# Patient Record
Sex: Female | Born: 1935 | Race: White | Hispanic: No | Marital: Married | Smoking: Never smoker
Health system: Southern US, Community
[De-identification: ages and names within clinical notes are randomized; demographics above are authoritative.]

## PROBLEM LIST (undated history)

## (undated) DIAGNOSIS — R066 Hiccough: Secondary | ICD-10-CM

## (undated) DIAGNOSIS — F0390 Unspecified dementia without behavioral disturbance: Secondary | ICD-10-CM

## (undated) DIAGNOSIS — K219 Gastro-esophageal reflux disease without esophagitis: Secondary | ICD-10-CM

## (undated) DIAGNOSIS — E079 Disorder of thyroid, unspecified: Secondary | ICD-10-CM

## (undated) DIAGNOSIS — R410 Disorientation, unspecified: Secondary | ICD-10-CM

## (undated) DIAGNOSIS — M1990 Unspecified osteoarthritis, unspecified site: Secondary | ICD-10-CM

## (undated) DIAGNOSIS — I10 Essential (primary) hypertension: Secondary | ICD-10-CM

## (undated) DIAGNOSIS — H409 Unspecified glaucoma: Secondary | ICD-10-CM

## (undated) HISTORY — DX: Essential (primary) hypertension: I10

## (undated) HISTORY — DX: Disorder of thyroid, unspecified: E07.9

## (undated) HISTORY — DX: Unspecified glaucoma: H40.9

## (undated) HISTORY — DX: Disorientation, unspecified: R41.0

## (undated) HISTORY — DX: Hiccough: R06.6

## (undated) HISTORY — DX: Gastro-esophageal reflux disease without esophagitis: K21.9

---

## 1998-08-31 ENCOUNTER — Other Ambulatory Visit: Admission: RE | Admit: 1998-08-31 | Discharge: 1998-08-31 | Payer: Self-pay | Admitting: Family Medicine

## 1999-09-28 ENCOUNTER — Other Ambulatory Visit: Admission: RE | Admit: 1999-09-28 | Discharge: 1999-09-28 | Payer: Self-pay | Admitting: Family Medicine

## 2000-11-13 ENCOUNTER — Other Ambulatory Visit: Admission: RE | Admit: 2000-11-13 | Discharge: 2000-11-13 | Payer: Self-pay | Admitting: Family Medicine

## 2003-11-18 ENCOUNTER — Other Ambulatory Visit: Admission: RE | Admit: 2003-11-18 | Discharge: 2003-11-18 | Payer: Self-pay | Admitting: Family Medicine

## 2005-08-20 ENCOUNTER — Ambulatory Visit (HOSPITAL_COMMUNITY): Admission: RE | Admit: 2005-08-20 | Discharge: 2005-08-20 | Payer: Self-pay | Admitting: Ophthalmology

## 2005-11-19 ENCOUNTER — Ambulatory Visit (HOSPITAL_COMMUNITY): Admission: RE | Admit: 2005-11-19 | Discharge: 2005-11-19 | Payer: Self-pay | Admitting: Ophthalmology

## 2006-01-29 ENCOUNTER — Other Ambulatory Visit: Admission: RE | Admit: 2006-01-29 | Discharge: 2006-01-29 | Payer: Self-pay | Admitting: Family Medicine

## 2009-07-25 ENCOUNTER — Ambulatory Visit (HOSPITAL_COMMUNITY): Admission: RE | Admit: 2009-07-25 | Discharge: 2009-07-25 | Payer: Self-pay | Admitting: Ophthalmology

## 2009-11-07 ENCOUNTER — Ambulatory Visit (HOSPITAL_COMMUNITY): Admission: RE | Admit: 2009-11-07 | Discharge: 2009-11-07 | Payer: Self-pay | Admitting: Ophthalmology

## 2010-08-07 LAB — BASIC METABOLIC PANEL
BUN: 17 mg/dL (ref 6–23)
Chloride: 101 mEq/L (ref 96–112)
Glucose, Bld: 80 mg/dL (ref 70–99)
Potassium: 4.7 mEq/L (ref 3.5–5.1)
Sodium: 135 mEq/L (ref 135–145)

## 2010-08-14 LAB — BASIC METABOLIC PANEL
BUN: 15 mg/dL (ref 6–23)
CO2: 30 mEq/L (ref 19–32)
Chloride: 103 mEq/L (ref 96–112)
Creatinine, Ser: 1 mg/dL (ref 0.4–1.2)
GFR calc Af Amer: >60 mL/min (ref >60)
Potassium: 4.5 mEq/L (ref 3.5–5.1)

## 2010-08-14 LAB — HEMOGLOBIN AND HEMATOCRIT, BLOOD: HCT: 40.6 % (ref 36.0–46.0)

## 2012-09-11 ENCOUNTER — Ambulatory Visit (INDEPENDENT_AMBULATORY_CARE_PROVIDER_SITE_OTHER): Payer: Medicare Other | Admitting: Nurse Practitioner

## 2012-09-11 VITALS — BP 162/80 | HR 85 | Temp 98.2°F | Ht 60.0 in | Wt 134.0 lb

## 2012-09-11 DIAGNOSIS — N200 Calculus of kidney: Secondary | ICD-10-CM

## 2012-09-11 DIAGNOSIS — I10 Essential (primary) hypertension: Secondary | ICD-10-CM

## 2012-09-11 DIAGNOSIS — M899 Disorder of bone, unspecified: Secondary | ICD-10-CM

## 2012-09-11 DIAGNOSIS — M8580 Other specified disorders of bone density and structure, unspecified site: Secondary | ICD-10-CM | POA: Insufficient documentation

## 2012-09-11 DIAGNOSIS — E039 Hypothyroidism, unspecified: Secondary | ICD-10-CM | POA: Insufficient documentation

## 2012-09-11 DIAGNOSIS — L989 Disorder of the skin and subcutaneous tissue, unspecified: Secondary | ICD-10-CM

## 2012-09-11 MED ORDER — DOXYCYCLINE HYCLATE 100 MG PO TABS: 100.0000 mg | ORAL_TABLET | Freq: Two times a day (BID) | ORAL | Status: DC

## 2012-09-11 NOTE — Patient Instructions (Signed)
Wound Care  Wound care helps prevent pain and infection.    You may need a tetanus shot if:   You cannot remember when you had your last tetanus shot.   You have never had a tetanus shot.   The injury broke your skin.  If you need a tetanus shot and you choose not to have one, you may get tetanus. Sickness from tetanus can be serious.  HOME CARE     Only take medicine as told by your doctor.   Clean the wound daily with mild soap and water.   Change any bandages (dressings) as told by your doctor.   Put medicated cream and a bandage on the wound as told by your doctor.   Change the bandage if it gets wet, dirty, or starts to smell.   Take showers. Do not take baths, swim, or do anything that puts your wound under water.   Rest and raise (elevate) the wound until the pain and puffiness (swelling) are better.   Keep all doctor visits as told.  GET HELP RIGHT AWAY IF:     Yellowish-white fluid (pus) comes from the wound.   Medicine does not lessen your pain.   There is a red streak going away from the wound.   You have a fever.  MAKE SURE YOU:     Understand these instructions.   Will watch your condition.   Will get help right away if you are not doing well or get worse.  Document Released: 02/14/2008 Document Revised: 07/30/2011 Document Reviewed: 09/10/2010  [employer] Patient Information 2013 [employer].

## 2012-09-11 NOTE — Progress Notes (Signed)
  Subjective:    Patient ID: Ruth Case, female    DOB: [birth year], 79 y.o.   MRN: [id number]  HPI Patient has found some sore spots on the back of right knee. Patient has gotten ticks off of herself and wonders if that is what these areas are from.    Review of Systems  Constitutional: Negative for fever, chills and fatigue.  Eyes: Negative.   Respiratory: Negative.   Cardiovascular: Negative.   Neurological: Negative for dizziness and headaches.       Objective:   Physical Exam  Constitutional: She appears well-developed and well-nourished.  Cardiovascular: Normal rate, regular rhythm and normal heart sounds.   Pulmonary/Chest: Effort normal and breath sounds normal.  Skin:     three 1 cm erythematous lesion in shape of triangle right post thigh. Erythema extending from most medial lesion.   BP 162/80  Pulse 85  Temp(Src) 98.2 F (36.8 C) (Oral)  Ht 5' (1.524 m)  Wt 134 lb (60.782 kg)  BMI 26.17 kg/m2       Assessment & Plan:  3 skin lesions right post thigh secondary to bug bite  Doxycycline 100mg  1 PO BID X 10 days 0 refills  Keep area clean and dry  Follow-upif worsening  Mary-Margaret Daphine Deutscher, FNP

## 2012-10-16 ENCOUNTER — Encounter: Payer: Self-pay | Admitting: Nurse Practitioner

## 2012-10-16 ENCOUNTER — Ambulatory Visit (INDEPENDENT_AMBULATORY_CARE_PROVIDER_SITE_OTHER): Payer: Medicare Other | Admitting: Nurse Practitioner

## 2012-10-16 VITALS — BP 146/81 | HR 83 | Temp 98.1°F | Ht 60.5 in | Wt 129.0 lb

## 2012-10-16 DIAGNOSIS — R413 Other amnesia: Secondary | ICD-10-CM

## 2012-10-16 DIAGNOSIS — R51 Headache: Secondary | ICD-10-CM

## 2012-10-16 LAB — COMPLETE METABOLIC PANEL WITH GFR
ALT: 13 U/L (ref 0–35)
AST: 20 U/L (ref 0–37)
Alkaline Phosphatase: 99 U/L (ref 39–117)
CO2: 30 mEq/L (ref 19–32)
Creat: 0.84 mg/dL (ref 0.50–1.10)
Sodium: 139 mEq/L (ref 135–145)
Total Bilirubin: 0.9 mg/dL (ref 0.3–1.2)
Total Protein: 7.1 g/dL (ref 6.0–8.3)

## 2012-10-16 LAB — POCT UA - MICROSCOPIC ONLY: Crystals, Ur, HPF, POC: NEGATIVE

## 2012-10-16 LAB — POCT CBC
Hemoglobin: 15.7 g/dL (ref 12.2–16.2)
Lymph, poc: 1.5 (ref 0.6–3.4)
MCH, POC: 29.4 pg (ref 27–31.2)
MCHC: 33.3 g/dL (ref 31.8–35.4)
MPV: 8.6 fL (ref 0–99.8)
POC Granulocyte: 5.8 (ref 2–6.9)
POC LYMPH PERCENT: 19.4 %L (ref 10–50)
Platelet Count, POC: 238 10*3/uL (ref 142–424)
RDW, POC: 15.1 %

## 2012-10-16 LAB — POCT URINALYSIS DIPSTICK
Glucose, UA: NEGATIVE
Nitrite, UA: NEGATIVE
Spec Grav, UA: 1.02
Urobilinogen, UA: NEGATIVE
pH, UA: 6

## 2012-10-16 LAB — THYROID PANEL WITH TSH
Free Thyroxine Index: 3.1 (ref 1.0–3.9)
T4, Total: 9.4 ug/dL (ref 5.0–12.5)
TSH: 4.54 u[IU]/mL — ABNORMAL HIGH (ref 0.350–4.500)

## 2012-10-16 MED ORDER — DONEPEZIL HCL 10 MG PO TABS: 10.0000 mg | ORAL_TABLET | Freq: Every evening | ORAL | Status: DC | PRN

## 2012-10-16 MED ORDER — MEMANTINE HCL 5 MG PO TABS: 5.0000 mg | ORAL_TABLET | Freq: Two times a day (BID) | ORAL | Status: DC

## 2012-10-16 NOTE — Progress Notes (Signed)
  Subjective:    Patient ID: Ruth Case, female    DOB: 28-Apr-1928, 79 y.o.   MRN: [id number]  HPI Brought in by family for evaluation because famiily called DR. Christell Constant stating that patient had been having frequent headaches and has been acting strange. Talking out if her head at times. Has been saying that "Cat keeps locking her out of the HOuse". Loosing things, unsafe to drive. With headaches they occur daily and family says they were pretty severe. Confusion but no nausea or vomiting. Denies blurred vision. No c/o dizziness or unsteadiness. No recent weight loss or weight gain.    Review of Systems  All other systems reviewed and are negative.       Objective:   Physical Exam  Constitutional: She is oriented to person, place, and time. She appears well-developed and well-nourished.  Neck: Normal range of motion.  Cardiovascular: Normal rate.   Murmur (2/6 systolic) heard. Pulmonary/Chest: Effort normal and breath sounds normal.  Abdominal: Soft. Bowel sounds are normal.  Musculoskeletal: Normal range of motion.  Neurological: She is alert and oriented to person, place, and time. She has normal reflexes.  Skin: Skin is warm and dry.  Psychiatric: She has a normal mood and affect. Her behavior is normal. Judgment and thought content normal.  mini mental exam score of 19. BP 146/81  Pulse 83  Temp(Src) 98.1 F (36.7 C) (Oral)  Ht 5' 0.5" (1.537 m)  Wt 129 lb (58.514 kg)  BMI 24.77 kg/m2       Assessment & Plan:  1. Chronic headaches Keep diary of headaches Avoid caffeine - MR Brain W Wo Contrast; Future  2. Memory changes Family will watch her closely - donepezil (ARICEPT) 10 MG tablet; Take 1 tablet (10 mg total) by mouth at bedtime as needed.  Dispense: 30 tablet; Refill: 5 - memantine (NAMENDA) 5 MG tablet; Take 1 tablet (5 mg total) by mouth 2 (two) times daily.  Dispense: 30 tablet; Refill: 5 - POCT CBC - COMPLETE METABOLIC PANEL WITH GFR - Vitamin D 25  hydroxy - NMR Lipoprofile with Lipids - Thyroid Panel With TSH - POCT urinalysis dipstick - POCT UA - Microscopic Only - MR Brain W Wo Contrast; Future   Mary-Margaret Daphine Deutscher, FNP

## 2012-10-16 NOTE — Patient Instructions (Signed)
Dementia Dementia is a general term for problems with brain function. A person with dementia has memory loss and a hard time with at least one other brain function such as thinking, speaking, or problem solving. Dementia can affect social functioning, how you do your job, your mood, or your personality. The changes may be hidden for a long time. The earliest forms of this disease are usually not detected by family or friends. Dementia can be:  Irreversible.  Potentially reversible.  Partially reversible.  Progressive. This means it can get worse over time. CAUSES  Irreversible dementia causes may include:  Degeneration of brain cells (Alzheimer's disease or lewy body dementia).  Multiple small strokes (vascular dementia).  Infection (chronic meningitis or Creutzfelt-Jakob disease).  Frontotemporal dementia. This affects younger people, age 40 to 70, compared to those who have Alzheimer's disease.  Dementia associated with other disorders like Parkinson's disease, Huntington's disease, or HIV-associated dementia. Potentially or partially reversible dementia causes may include:  Medicines.  Metabolic causes such as excessive alcohol intake, vitamin B12 deficiency, or thyroid disease.  Masses or pressure in the brain such as a tumor, blood clot, or hydrocephalus. SYMPTOMS  Symptoms are often hard to detect. Family members or coworkers may not notice them early in the disease process. Different people with dementia may have different symptoms. Symptoms can include:  A hard time with memory, especially recent memory. Long-term memory may not be impaired.  Asking the same question multiple times or forgetting something someone just said.  A hard time speaking your thoughts or finding certain words.  A hard time solving problems or performing familiar tasks (such as how to use a telephone).  Sudden changes in mood.  Changes in personality, especially increasing moodiness or  mistrust.  Depression.  A hard time understanding complex ideas that were never a problem in the past. DIAGNOSIS  There are no specific tests for dementia.   Your caregiver may recommend a thorough evaluation. This is because some forms of dementia can be reversible. The evaluation will likely include a physical exam and getting a detailed history from you and a family member. The history often gives the best clues and suggestions for a diagnosis.  Memory testing may be done. A detailed brain function evaluation called neuropsychologic testing may be helpful.  Lab tests and brain imaging (such as a CT scan or MRI scan) are sometimes important.  Sometimes observation and re-evaluation over time is very helpful. TREATMENT  Treatment depends on the cause.   If the problem is a vitamin deficiency, it may be helped or cured with supplements.  For dementias such as Alzheimer's disease, medicines are available to stabilize or slow the course of the disease. There are no cures for this type of dementia.  Your caregiver can help direct you to groups, organizations, and other caregivers to help with decisions in the care of you or your loved one. HOME CARE INSTRUCTIONS The care of individuals with dementia is varied and dependent upon the progression of the dementia. The following suggestions are intended for the person living with, or caring for, the person with dementia.  Create a safe environment.  Remove the locks on bathroom doors to prevent the person from accidentally locking himself or herself in.  Use childproof latches on kitchen cabinets and any place where cleaning supplies, chemicals, or alcohol are kept.  Use childproof covers in unused electrical outlets.  Install childproof devices to keep doors and windows secured.  Remove stove knobs or install safety   knobs and an automatic shut-off on the stove.  Lower the temperature on water heaters.  Label medicines and keep them  locked up.  Secure knives, lighters, matches, power tools, and guns, and keep these items out of reach.  Keep the house free from clutter. Remove rugs or anything that might contribute to a fall.  Remove objects that might break and hurt the person.  Make sure lighting is good, both inside and outside.  Install grab rails as needed.  Use a monitoring device to alert you to falls or other needs for help.  Reduce confusion.  Keep familiar objects and people around.  Use night lights or dim lights at night.  Label items or areas.  Use reminders, notes, or directions for daily activities or tasks.  Keep a simple, consistent routine for waking, meals, bathing, dressing, and bedtime.  Create a calm, quiet environment.  Place large clocks and calendars prominently.  Display emergency numbers and home address near all telephones.  Use cues to establish different times of the day. An example is to open curtains to let the natural light in during the day.   Use effective communication.  Choose simple words and short sentences.  Use a gentle, calm tone of voice.  Be careful not to interrupt.  If the person is struggling to find a word or communicate a thought, try to provide the word or thought.  Ask one question at a time. Allow the person ample time to answer questions. Repeat the question again if the person does not respond.  Reduce nighttime restlessness.  Provide a comfortable bed.  Have a consistent nighttime routine.  Ensure a regular walking or physical activity schedule. Involve the person in daily activities as much as possible.  Limit napping during the day.  Limit caffeine.  Attend social events that stimulate rather than overwhelm the senses.  Encourage good nutrition and hydration.  Reduce distractions during meal times and snacks.  Avoid foods that are too hot or too cold.  Monitor chewing and swallowing ability.  Continue with routine vision,  hearing, dental, and medical screenings.  Only give over-the-counter or prescription medicines as directed by the caregiver.  Monitor driving abilities. Do not allow the person to drive when safe driving is no longer possible.  Register with an identification program which could provide location assistance in the event of a missing person situation. SEEK MEDICAL CARE IF:   New behavioral problems start such as moodiness, aggressiveness, or seeing things that are not there (hallucinations).  Any new problem with brain function happens. This includes problems with balance, speech, or falling a lot.  Problems with swallowing develop.  Any symptoms of other illness happen. Small changes or worsening in any aspect of brain function can be a sign that the illness is getting worse. It can also be a sign of another medical illness such as infection. Seeing a caregiver right away is important. SEEK IMMEDIATE MEDICAL CARE IF:   A fever develops.  New or worsened confusion develops.  New or worsened sleepiness develops.  Staying awake becomes hard to do. Document Released: 10/31/2000 Document Revised: 07/30/2011 Document Reviewed: 10/02/2010 Exit[employer] Patient Information 2014 [employer].  

## 2012-10-17 ENCOUNTER — Other Ambulatory Visit: Payer: Self-pay | Admitting: Nurse Practitioner

## 2012-10-17 LAB — NMR LIPOPROFILE WITH LIPIDS
Cholesterol, Total: 174 mg/dL (ref <200)
HDL Particle Number: 34.9 umol/L (ref >=30.5)
HDL-C: 67 mg/dL (ref >=40)
LDL (calc): 91 mg/dL (ref <100)
LDL Particle Number: 909 nmol/L (ref <1000)
LP-IR Score: <25 (ref <=45)
Small LDL Particle Number: <90 nmol/L (ref <=527)
Triglycerides: 81 mg/dL (ref <150)
VLDL Size: 45.7 nm (ref <=46.6)

## 2012-10-17 MED ORDER — LEVOTHYROXINE SODIUM 75 MCG PO TABS: 75.0000 ug | ORAL_TABLET | Freq: Every day | ORAL | Status: DC

## 2012-10-18 ENCOUNTER — Ambulatory Visit (INDEPENDENT_AMBULATORY_CARE_PROVIDER_SITE_OTHER): Payer: Medicare Other | Admitting: Nurse Practitioner

## 2012-10-18 VITALS — BP 146/80 | Temp 97.5°F | Ht 60.5 in | Wt 129.0 lb

## 2012-10-18 DIAGNOSIS — R112 Nausea with vomiting, unspecified: Secondary | ICD-10-CM

## 2012-10-18 DIAGNOSIS — R51 Headache: Secondary | ICD-10-CM

## 2012-10-18 MED ORDER — MEPERIDINE HCL 25 MG/ML IJ SOLN
50.0000 mg | INTRAMUSCULAR | Status: AC
  Administered 2012-10-18: 50 mg via INTRAMUSCULAR

## 2012-10-18 MED ORDER — ONDANSETRON HCL 4 MG PO TABS: 4.0000 mg | ORAL_TABLET | Freq: Three times a day (TID) | ORAL | Status: DC | PRN

## 2012-10-18 MED ORDER — PROMETHAZINE HCL 25 MG/ML IJ SOLN
25.0000 mg | Freq: Once | INTRAMUSCULAR | Status: AC
  Administered 2012-10-18: 25 mg via INTRAMUSCULAR

## 2012-10-18 NOTE — Patient Instructions (Signed)

## 2012-10-18 NOTE — Progress Notes (Signed)
Subjective:     Patient ID: Doralee Albino, female   DOB: 24-Apr-1928, 79 y.o.   MRN: [id number]  HPI Patient brought in oday with nausea and vomiting that started this morning- She had a slight headache last night that has lasted through the night- She was just seen in the office last week and MRI of brain has already been scheduled. Patient unable to say anything about severity of headache due to dementia. She does say that she does not have any blurred vision.   Review of Systems  Constitutional: Positive for appetite change (decreased).  Eyes: Negative.   Respiratory: Negative.   Cardiovascular: Negative.   Gastrointestinal: Negative.   Endocrine: Negative.   Genitourinary: Negative.   Neurological: Positive for headaches. Negative for dizziness, tremors, facial asymmetry, speech difficulty, weakness and light-headedness.  Hematological: Negative.   Psychiatric/Behavioral: Negative.        Objective:   Physical Exam  Constitutional: She is oriented to person, place, and time. She appears well-developed and well-nourished.  Cardiovascular: Normal rate, normal heart sounds and intact distal pulses.   Pulmonary/Chest: Effort normal and breath sounds normal.  Abdominal: Soft. Bowel sounds are normal.  Dry heaves and vomiting during exam  Musculoskeletal: Normal range of motion.  Neurological: She is alert and oriented to person, place, and time. She has normal reflexes. No cranial nerve deficit.  Skin: Skin is warm.  Psychiatric: She has a normal mood and affect. Her behavior is normal. Judgment and thought content normal.  BP 146/80  Temp(Src) 97.5 F (36.4 C) (Oral)  Ht 5' 0.5" (1.537 m)  Wt 129 lb (58.514 kg)  BMI 24.77 kg/m2       Assessment:     1. Nausea with vomiting   2. Headache(784.0)         Plan:    Meds ordered this encounter  Medications  . meperidine (DEMEROL) injection 50 mg    Sig:   . promethazine (PHENERGAN) injection 25 mg    Sig:    First  24 Hours-Clear liquids  popsicles  Jello  gatorade  Sprite Second 24 hours-Add Full liquids ( Liquids you cant see through) Third 24 hours- Bland diet ( foods that are baked or broiled)  *avoiding fried foods and highly spiced foods* During these 3 days  Avoid milk, cheese, ice cream or any other dairy products  Avoid caffeine- REMEMBER Mt. Dew and Mello Yellow contain lots of caffeine You should eat and drink in  Frequent small volumes If no improvement in symptoms or worsen in 2-3 days should RETRUN TO OFFICE or go to ER!    Mary-Margaret Daphine Deutscher, FNP

## 2012-10-20 ENCOUNTER — Other Ambulatory Visit: Payer: Self-pay

## 2012-10-20 DIAGNOSIS — R413 Other amnesia: Secondary | ICD-10-CM

## 2012-10-22 ENCOUNTER — Ambulatory Visit
Admission: RE | Admit: 2012-10-22 | Discharge: 2012-10-22 | Disposition: A | Discharge status: Home / Self Care | Payer: 59 | Source: Ambulatory Visit | Attending: Nurse Practitioner | Admitting: Nurse Practitioner

## 2012-10-22 ENCOUNTER — Inpatient Hospital Stay
Admission: RE | Admit: 2012-10-22 | Discharge: 2012-10-22 | Disposition: A | Discharge status: Home / Self Care | Payer: Self-pay | Source: Ambulatory Visit | Attending: Nurse Practitioner | Admitting: Nurse Practitioner

## 2012-10-22 DIAGNOSIS — R413 Other amnesia: Secondary | ICD-10-CM

## 2012-10-24 ENCOUNTER — Telehealth: Payer: Self-pay | Admitting: Nurse Practitioner

## 2012-10-24 NOTE — Telephone Encounter (Signed)
Aware of results. Daughter will call us again if no improvement.

## 2012-11-03 ENCOUNTER — Telehealth: Payer: Self-pay | Admitting: Nurse Practitioner

## 2012-11-03 NOTE — Telephone Encounter (Signed)
NTBS.

## 2012-11-03 NOTE — Telephone Encounter (Signed)
Called and spoke with daughter. Has several different things going on. Has refused to come to office. Family has been trying to get her to come in for visit. Daughter will tell her she is to have thyroid rechecked to get her to come. If patient refuses will call and let us know.

## 2012-11-03 NOTE — Telephone Encounter (Signed)
Or have family call to discuss

## 2012-11-03 NOTE — Telephone Encounter (Signed)
Please advise 

## 2012-11-06 ENCOUNTER — Ambulatory Visit: Payer: Medicare Other | Admitting: Nurse Practitioner

## 2012-11-20 ENCOUNTER — Other Ambulatory Visit (INDEPENDENT_AMBULATORY_CARE_PROVIDER_SITE_OTHER): Payer: Medicare Other

## 2012-11-20 ENCOUNTER — Telehealth: Payer: Self-pay | Admitting: Nurse Practitioner

## 2012-11-20 DIAGNOSIS — E039 Hypothyroidism, unspecified: Secondary | ICD-10-CM

## 2012-11-20 LAB — THYROID PANEL WITH TSH: T4, Total: 10.7 ug/dL (ref 5.0–12.5)

## 2012-11-20 NOTE — Telephone Encounter (Signed)
Please advise 

## 2012-11-20 NOTE — Telephone Encounter (Signed)
I attempted to contact patient, without success. Please try to call her and inform if she continues to have diarrhea she may try OTC imodium as directed. She may need a med for acid reflux, but would need to be seen. If symptoms persist or worsen, seek emergency medical treatment. thx

## 2012-11-20 NOTE — Telephone Encounter (Signed)
Aware. 

## 2012-11-24 NOTE — Telephone Encounter (Signed)
Beano OTC and imodium for loose stools if doesn't improve will need to send to GI

## 2012-11-24 NOTE — Telephone Encounter (Signed)
Pt doesn't have diarrhea now but has not had a BM in four days. Still having the belching. They will call back if she doesn't improve.

## 2012-11-26 ENCOUNTER — Telehealth: Payer: Self-pay | Admitting: Nurse Practitioner

## 2012-11-26 NOTE — Telephone Encounter (Signed)
Patient is taking 39 and is aware of results

## 2012-12-09 ENCOUNTER — Telehealth: Payer: Self-pay | Admitting: Nurse Practitioner

## 2012-12-09 NOTE — Telephone Encounter (Signed)
Please advise 

## 2012-12-09 NOTE — Telephone Encounter (Signed)
Beano and gas x are all i know that work- may try [employer] gtts that kids use

## 2012-12-10 NOTE — Telephone Encounter (Signed)
Daughter aware.

## 2012-12-16 ENCOUNTER — Other Ambulatory Visit: Payer: Self-pay | Admitting: *Deleted

## 2012-12-16 MED ORDER — VALSARTAN 160 MG PO TABS: 160.0000 mg | ORAL_TABLET | Freq: Every day | ORAL | Status: DC

## 2013-03-05 ENCOUNTER — Ambulatory Visit: Payer: Medicare Other | Admitting: Nurse Practitioner

## 2013-03-27 ENCOUNTER — Ambulatory Visit: Payer: Medicare Other | Admitting: Nurse Practitioner

## 2013-04-10 ENCOUNTER — Encounter: Payer: Self-pay | Admitting: Nurse Practitioner

## 2013-04-10 ENCOUNTER — Ambulatory Visit (INDEPENDENT_AMBULATORY_CARE_PROVIDER_SITE_OTHER): Payer: Medicare Other | Admitting: Nurse Practitioner

## 2013-04-10 VITALS — BP 168/94 | HR 77 | Temp 99.1°F | Ht 60.0 in | Wt 121.0 lb

## 2013-04-10 DIAGNOSIS — R141 Gas pain: Secondary | ICD-10-CM

## 2013-04-10 DIAGNOSIS — R142 Eructation: Secondary | ICD-10-CM

## 2013-04-10 DIAGNOSIS — N3281 Overactive bladder: Secondary | ICD-10-CM

## 2013-04-10 DIAGNOSIS — M899 Disorder of bone, unspecified: Secondary | ICD-10-CM

## 2013-04-10 DIAGNOSIS — R413 Other amnesia: Secondary | ICD-10-CM

## 2013-04-10 DIAGNOSIS — E039 Hypothyroidism, unspecified: Secondary | ICD-10-CM

## 2013-04-10 DIAGNOSIS — M8580 Other specified disorders of bone density and structure, unspecified site: Secondary | ICD-10-CM

## 2013-04-10 DIAGNOSIS — Z23 Encounter for immunization: Secondary | ICD-10-CM

## 2013-04-10 DIAGNOSIS — I10 Essential (primary) hypertension: Secondary | ICD-10-CM

## 2013-04-10 DIAGNOSIS — R066 Hiccough: Secondary | ICD-10-CM

## 2013-04-10 DIAGNOSIS — N318 Other neuromuscular dysfunction of bladder: Secondary | ICD-10-CM

## 2013-04-10 MED ORDER — SOLIFENACIN SUCCINATE 10 MG PO TABS: 10.0000 mg | ORAL_TABLET | Freq: Every day | ORAL | Status: DC

## 2013-04-10 MED ORDER — MEMANTINE HCL 5 MG PO TABS: 5.0000 mg | ORAL_TABLET | Freq: Two times a day (BID) | ORAL | Status: DC

## 2013-04-10 MED ORDER — DONEPEZIL HCL 10 MG PO TABS: 10.0000 mg | ORAL_TABLET | Freq: Every evening | ORAL | Status: DC | PRN

## 2013-04-10 MED ORDER — BACLOFEN 10 MG PO TABS: 5.0000 mg | ORAL_TABLET | Freq: Three times a day (TID) | ORAL | Status: DC

## 2013-04-10 MED ORDER — LEVOTHYROXINE SODIUM 75 MCG PO TABS: 75.0000 ug | ORAL_TABLET | Freq: Every day | ORAL | Status: DC

## 2013-04-10 MED ORDER — ESOMEPRAZOLE MAGNESIUM 20 MG PO CPDR: 20.0000 mg | DELAYED_RELEASE_CAPSULE | Freq: Every day | ORAL | Status: DC

## 2013-04-10 MED ORDER — VALSARTAN 160 MG PO TABS: 160.0000 mg | ORAL_TABLET | Freq: Every day | ORAL | Status: DC

## 2013-04-10 NOTE — Progress Notes (Signed)
Subjective:    Patient ID: Ruth Case, female    DOB: 24-Aug-1927, 79 y.o.   MRN: [id number]  HPI patient in today for chronic follow up- she is doing ok - the only complaints is that she has spells of belchng that can last several hours- they have tried gasx and beano and otc prilosec with no relief. Happens several times a day. Patient Active Problem List   Diagnosis Date Noted  . Unspecified hypothyroidism 09/11/2012  . Essential hypertension, benign 09/11/2012  . Osteopenia 09/11/2012  . Nephrolithiasis 09/11/2012   Outpatient Encounter Prescriptions as of 04/10/2013  Medication Sig  . donepezil (ARICEPT) 10 MG tablet Take 1 tablet (10 mg total) by mouth at bedtime as needed.  Marland Kitchen esomeprazole (NEXIUM) 20 MG capsule Take 20 mg by mouth daily at 12 noon.  Marland Kitchen levothyroxine (SYNTHROID, LEVOTHROID) 75 MCG tablet Take 1 tablet (75 mcg total) by mouth daily.  . memantine (NAMENDA) 5 MG tablet Take 1 tablet (5 mg total) by mouth 2 (two) times daily.  . ondansetron (ZOFRAN) 4 MG tablet Take 1 tablet (4 mg total) by mouth every 8 (eight) hours as needed for nausea.  . valsartan (DIOVAN) 160 MG tablet Take 1 tablet (160 mg total) by mouth daily.  . [DISCONTINUED] levothyroxine (SYNTHROID, LEVOTHROID) 50 MCG tablet Take 50 mcg by mouth daily before breakfast.       Review of Systems  Constitutional: Negative.   HENT: Negative.   Respiratory: Negative.   Cardiovascular: Negative.   Gastrointestinal: Negative for nausea, abdominal pain, constipation and blood in stool.  Genitourinary: Negative.   Musculoskeletal: Negative.   Skin: Negative.   Hematological: Negative.   Psychiatric/Behavioral: Negative.        Objective:   Physical Exam  Constitutional: She is oriented to person, place, and time. She appears well-developed and well-nourished.  HENT:  Nose: Nose normal.  Mouth/Throat: Oropharynx is clear and moist.  Eyes: EOM are normal.  Neck: Trachea normal, normal range of  motion and full passive range of motion without pain. Neck supple. No JVD present. Carotid bruit is not present. No thyromegaly present.  Cardiovascular: Normal rate, regular rhythm, normal heart sounds and intact distal pulses.  Exam reveals no gallop and no friction rub.   No murmur heard. Pulmonary/Chest: Effort normal and breath sounds normal.  Abdominal: Soft. Bowel sounds are normal. She exhibits no distension and no mass. There is no tenderness.  Musculoskeletal: Normal range of motion.  Lymphadenopathy:    She has no cervical adenopathy.  Neurological: She is alert and oriented to person, place, and time. She has normal reflexes.  Skin: Skin is warm and dry.  Psychiatric: She has a normal mood and affect. Her behavior is normal. Judgment and thought content normal.   BP 168/94  Pulse 77  Temp(Src) 99.1 F (37.3 C) (Oral)  Ht 5' (1.524 m)  Wt 121 lb (54.885 kg)  BMI 23.63 kg/m2        Assessment & Plan:   1. Unspecified hypothyroidism   2. Essential hypertension, benign   3. Osteopenia   4. Overactive bladder   5. Belching   6. Hiccups   7. Memory changes    Orders Placed This Encounter  Procedures  . CMP14+EGFR  . NMR, lipoprofile  . H Pylori, IGM, IGG, IGA AB  . Thyroid Panel With TSH   Meds ordered this encounter  Medications  . DISCONTD: esomeprazole (NEXIUM) 20 MG capsule    Sig: Take 20 mg by mouth  daily at 12 noon.  Marland Kitchen DISCONTD: solifenacin (VESICARE) 10 MG tablet    Sig: Take 1 tablet (10 mg total) by mouth daily.    Dispense:  30 tablet    Refill:  5    Order Specific Question:  Supervising Provider    Answer:  Ernestina Penna [[id number]]  . donepezil (ARICEPT) 10 MG tablet    Sig: Take 1 tablet (10 mg total) by mouth at bedtime as needed.    Dispense:  30 tablet    Refill:  5    Order Specific Question:  Supervising Provider    Answer:  Ernestina Penna [[id number]]  . esomeprazole (NEXIUM) 20 MG capsule    Sig: Take 1 capsule (20 mg total) by mouth daily  at 12 noon.    Dispense:  30 capsule    Refill:  6    Order Specific Question:  Supervising Provider    Answer:  Ernestina Penna [[id number]]  . levothyroxine (SYNTHROID, LEVOTHROID) 75 MCG tablet    Sig: Take 1 tablet (75 mcg total) by mouth daily.    Dispense:  90 tablet    Refill:  3    Order Specific Question:  Supervising Provider    Answer:  Ernestina Penna [[id number]]  . memantine (NAMENDA) 5 MG tablet    Sig: Take 1 tablet (5 mg total) by mouth 2 (two) times daily.    Dispense:  30 tablet    Refill:  5    Order Specific Question:  Supervising Provider    Answer:  Ernestina Penna [[id number]]  . solifenacin (VESICARE) 10 MG tablet    Sig: Take 1 tablet (10 mg total) by mouth daily.    Dispense:  30 tablet    Refill:  5    Order Specific Question:  Supervising Provider    Answer:  Ernestina Penna [[id number]]  . valsartan (DIOVAN) 160 MG tablet    Sig: Take 1 tablet (160 mg total) by mouth daily.    Dispense:  30 tablet    Refill:  6    Order Specific Question:  Supervising Provider    Answer:  Ernestina Penna [[id number]]  . baclofen (LIORESAL) 10 MG tablet    Sig: Take 0.5 tablets (5 mg total) by mouth 3 (three) times daily.    Dispense:  90 each    Refill:  1    Order Specific Question:  Supervising Provider    Answer:  Ernestina Penna [[id number]]   Baclofen for hiccups- let me know if helps vesicare started for bladder Continue all meds Labs pending Diet and exercise encouraged Health maintenance reviewed Follow up in 3 months  Mary-Margaret Daphine Deutscher, FNP

## 2013-04-10 NOTE — Patient Instructions (Signed)
Hiccups °Hiccups are caused by a sudden contraction of the muscles between the ribs and the muscle under your lungs (diaphragm). When you hiccup, the top of your windpipe (glottis) closes immediately after your diaphragm contracts. This makes the typical 'hic' sound. A hiccup is a reflex that you cannot stop. Unlike other reflexes such as coughing and sneezing, hiccups do not seem to have any useful purpose. There are 3 types of hiccups:  °· Benign bouts: last less than 48 hours. °· Persistent: last more than 48 hours but less than 1 month. °· Intractable: last more than 1 month. °CAUSES  °Most people have bouts of hiccups from time to time. They start for no apparent reason, last a short while, and then stop. Sometimes they are due to: °· A temporary swollen stomach caused by overeating or eating too fast, eating spicy foods, drinking fizzy drinks, or swallowing air. °· A sudden change in temperature (very hot or cold foods or drinks, a cold shower). °· Drinking alcohol or using tobacco. °There are no particular tests used to diagnose hiccups. Hiccups are usually considered harmless and do not point to a serious medical condition. However, there can be underlying medical problems that may cause hiccups, such as pneumonia, diabetes, metabolic problems, tumors, abdominal infections or injuries, and neurologic problems. You must follow up with your caregiver if your symptoms persist or become a frequent problem. °TREATMENT  °· Most cases need no treatment. A bout of benign hiccups usually does not last long. °· Medicine is sometimes needed to stop persistent hiccups. Medicine may be given intravenously (IV) or by mouth. °· Hypnosis or acupuncture may be suggested. °· Surgery to affect the nerve that supplies the diaphragm may be tried in severe cases. °· Treatment of an underlying cause is needed in some cases. °HOME CARE INSTRUCTIONS  °Popular remedies that may stop a bout of hiccups include: °· Gargling ice  water. °· Swallowing granulated sugar. °· Biting on a lemon. °· Holding your breath, breathing fast, or breathing into a paper bag. °· Bearing down. °· Gasping after a sudden fright. °· Pulling your tongue gently. °· Distraction. °SEEK MEDICAL CARE IF:  °· Hiccups last for more than 48 hours. °· You are given medicine but your hiccups do not get better. °· New symptoms show up. °· You cannot sleep or eat due to the hiccups. °· You have unexpected weight loss. °· You have trouble breathing or swallowing. °· You develop severe pain in your abdomen or other areas. °· You develop numbness, tingling, or weakness. °Document Released: 07/16/2001 Document Revised: 07/30/2011 Document Reviewed: 06/28/2010 °[employer]® Patient Information ©2014 [employer]. ° °

## 2013-04-13 ENCOUNTER — Telehealth: Payer: Self-pay | Admitting: Nurse Practitioner

## 2013-04-13 ENCOUNTER — Other Ambulatory Visit: Payer: Self-pay | Admitting: *Deleted

## 2013-04-13 ENCOUNTER — Other Ambulatory Visit (INDEPENDENT_AMBULATORY_CARE_PROVIDER_SITE_OTHER): Payer: Medicare Other

## 2013-04-13 DIAGNOSIS — E875 Hyperkalemia: Secondary | ICD-10-CM

## 2013-04-13 NOTE — Telephone Encounter (Signed)
Spoke with daughter and Ms. Socarras came into today and had labs repeated.

## 2013-04-13 NOTE — Progress Notes (Signed)
Pt here for labs only. 

## 2013-04-14 LAB — CMP14+EGFR
Albumin/Globulin Ratio: 1.2 (ref 1.1–2.5)
Albumin: 3.8 g/dL (ref 3.5–4.7)
Alkaline Phosphatase: 119 IU/L — ABNORMAL HIGH (ref 39–117)
BUN/Creatinine Ratio: 18 (ref 11–26)
BUN: 21 mg/dL (ref 8–27)
GFR calc Af Amer: 49 mL/min/{1.73_m2} — ABNORMAL LOW (ref >59)
GFR calc non Af Amer: 42 mL/min/{1.73_m2} — ABNORMAL LOW (ref >59)
Glucose: 77 mg/dL (ref 65–99)
Total Bilirubin: 0.2 mg/dL (ref 0.0–1.2)
Total Protein: 7 g/dL (ref 6.0–8.5)

## 2013-04-14 LAB — NMR, LIPOPROFILE
Cholesterol: 165 mg/dL (ref <200)
HDL Particle Number: 33.4 umol/L (ref >=30.5)
LDL Size: 21.1 nm (ref >20.5)
LP-IR Score: <25 (ref <=45)
Small LDL Particle Number: 96 nmol/L (ref <=527)
Triglycerides by NMR: 63 mg/dL (ref <150)

## 2013-04-14 LAB — BMP8+EGFR
BUN: 20 mg/dL (ref 8–27)
CO2: 26 mmol/L (ref 18–29)
Calcium: 9.3 mg/dL (ref 8.6–10.2)
Creatinine, Ser: 0.98 mg/dL (ref 0.57–1.00)
Glucose: 90 mg/dL (ref 65–99)
Sodium: 141 mmol/L (ref 134–144)

## 2013-04-14 LAB — H PYLORI, IGM, IGG, IGA AB: H. pylori, IgM Abs: <9 units (ref 0.0–8.9)

## 2013-04-14 LAB — THYROID PANEL WITH TSH
Free Thyroxine Index: 2.6 (ref 1.2–4.9)
T3 Uptake Ratio: 29 % (ref 24–39)

## 2013-07-13 ENCOUNTER — Ambulatory Visit: Payer: Medicare Other | Admitting: Nurse Practitioner

## 2013-07-29 ENCOUNTER — Ambulatory Visit (INDEPENDENT_AMBULATORY_CARE_PROVIDER_SITE_OTHER): Payer: Medicare Other | Admitting: Nurse Practitioner

## 2013-07-29 ENCOUNTER — Telehealth: Payer: Self-pay | Admitting: Nurse Practitioner

## 2013-07-29 ENCOUNTER — Encounter: Payer: Self-pay | Admitting: Nurse Practitioner

## 2013-07-29 VITALS — BP 122/73 | HR 63 | Temp 98.0°F | Ht 60.0 in | Wt 121.0 lb

## 2013-07-29 DIAGNOSIS — E039 Hypothyroidism, unspecified: Secondary | ICD-10-CM

## 2013-07-29 DIAGNOSIS — I10 Essential (primary) hypertension: Secondary | ICD-10-CM

## 2013-07-29 DIAGNOSIS — M8580 Other specified disorders of bone density and structure, unspecified site: Secondary | ICD-10-CM

## 2013-07-29 DIAGNOSIS — M949 Disorder of cartilage, unspecified: Secondary | ICD-10-CM

## 2013-07-29 DIAGNOSIS — M899 Disorder of bone, unspecified: Secondary | ICD-10-CM

## 2013-07-29 MED ORDER — DONEPEZIL HCL 23 MG PO TABS: 23.0000 mg | ORAL_TABLET | Freq: Every day | ORAL | Status: DC

## 2013-07-29 NOTE — Progress Notes (Signed)
Subjective:    Patient ID: Ruth Case, female    DOB: December 10, 1927, 80 y.o.   MRN: [id number]  HPI Patient here today for follow up- Her daughter in law is with her today. Patient has history of dementia- was started on namenda and aricept- About 1 month ago she starting sitting around a lot doing nothing so her son stopped NAmneda and that seemed to perk her up. But she started not being able to finish her sentnces so he put her back on it and she started zoning out again. * patient is starting to experience incontinence about 2x a week Patient Active Problem List   Diagnosis Date Noted  . Unspecified hypothyroidism 09/11/2012  . Essential hypertension, benign 09/11/2012  . Osteopenia 09/11/2012  . Nephrolithiasis 09/11/2012   Outpatient Encounter Prescriptions as of 07/29/2013  Medication Sig  . baclofen (LIORESAL) 10 MG tablet Take 0.5 tablets (5 mg total) by mouth 3 (three) times daily.  Marland Kitchen esomeprazole (NEXIUM) 20 MG capsule Take 1 capsule (20 mg total) by mouth daily at 12 noon.  Marland Kitchen levothyroxine (SYNTHROID, LEVOTHROID) 75 MCG tablet Take 1 tablet (75 mcg total) by mouth daily.  . ondansetron (ZOFRAN) 4 MG tablet Take 1 tablet (4 mg total) by mouth every 8 (eight) hours as needed for nausea.  . solifenacin (VESICARE) 10 MG tablet Take 1 tablet (10 mg total) by mouth daily.  . valsartan (DIOVAN) 160 MG tablet Take 1 tablet (160 mg total) by mouth daily.  . [DISCONTINUED] donepezil (ARICEPT) 10 MG tablet Take 1 tablet (10 mg total) by mouth at bedtime as needed.  . [DISCONTINUED] memantine (NAMENDA) 5 MG tablet Take 1 tablet (5 mg total) by mouth 2 (two) times daily.  Marland Kitchen donepezil (ARICEPT) 23 MG TABS tablet Take 1 tablet (23 mg total) by mouth at bedtime.      Review of Systems  Constitutional: Negative for appetite change and fatigue.  HENT: Negative.   Respiratory: Negative.   Cardiovascular: Negative.   Gastrointestinal: Negative.   Genitourinary: Negative.     Neurological: Negative.   Psychiatric/Behavioral: Positive for dysphoric mood and agitation.  All other systems reviewed and are negative.       Objective:   Physical Exam  Constitutional: She appears well-developed and well-nourished.  HENT:  Nose: Nose normal.  Mouth/Throat: Oropharynx is clear and moist.  Eyes: EOM are normal.  Neck: Trachea normal, normal range of motion and full passive range of motion without pain. Neck supple. No JVD present. Carotid bruit is not present. No thyromegaly present.  Cardiovascular: Normal rate, regular rhythm, normal heart sounds and intact distal pulses.  Exam reveals no gallop and no friction rub.   No murmur heard. Pulmonary/Chest: Effort normal and breath sounds normal.  Abdominal: Soft. Bowel sounds are normal. She exhibits no distension and no mass. There is no tenderness.  Musculoskeletal: Normal range of motion.  Lymphadenopathy:    She has no cervical adenopathy.  Neurological: She is alert. She has normal reflexes.  Skin: Skin is warm and dry.  Psychiatric: She has a normal mood and affect. Her behavior is normal. Judgment and thought content normal.  Answers all questions appropriately    BP 122/73  Pulse 63  Temp(Src) 98 F (36.7 C) (Oral)  Ht 5' (1.524 m)  Wt 121 lb (54.885 kg)  BMI 23.63 kg/m2       Assessment & Plan:   1. Unspecified hypothyroidism   2. Osteopenia   3. Essential hypertension, benign  Orders Placed This Encounter  Procedures  . CMP14+EGFR  . NMR, lipoprofile      Meds ordered this encounter  Medications  . donepezil (ARICEPT) 23 MG TABS tablet    Sig: Take 1 tablet (23 mg total) by mouth at bedtime.    Dispense:  30 tablet    Refill:  5    Order Specific Question:  Supervising Provider    Answer:  Chipper Herb [[id number]]    Labs pending Health maintenance reviewed Diet and exercise encouraged Continue all meds Follow up  In 3 months   [place], FNP

## 2013-07-29 NOTE — Telephone Encounter (Signed)
Patient is taking namenda bid and the aricept she takes at bedtime and her symptoms have come back please advise patients daughter stated that she spoke with you about this at her appt today.

## 2013-07-29 NOTE — Patient Instructions (Signed)

## 2013-07-30 NOTE — Telephone Encounter (Signed)
Hold the namenda

## 2013-07-30 NOTE — Telephone Encounter (Signed)
Patient daughter aware and will let us know how she is doing

## 2013-07-31 LAB — CMP14+EGFR
A/G RATIO: 1.5 (ref 1.1–2.5)
ALBUMIN: 3.7 g/dL (ref 3.5–4.7)
ALK PHOS: 102 IU/L (ref 39–117)
ALT: 15 IU/L (ref 0–32)
AST: 17 IU/L (ref 0–40)
BILIRUBIN TOTAL: 0.4 mg/dL (ref 0.0–1.2)
BUN / CREAT RATIO: 22 (ref 11–26)
BUN: 22 mg/dL (ref 8–27)
CHLORIDE: 102 mmol/L (ref 97–108)
CO2: 22 mmol/L (ref 18–29)
Calcium: 8.7 mg/dL (ref 8.7–10.3)
Creatinine, Ser: 1 mg/dL (ref 0.57–1.00)
GFR, EST AFRICAN AMERICAN: 60 mL/min/{1.73_m2} (ref >59)
GFR, EST NON AFRICAN AMERICAN: 52 mL/min/{1.73_m2} — AB (ref >59)
Globulin, Total: 2.5 g/dL (ref 1.5–4.5)
Glucose: 81 mg/dL (ref 65–99)
POTASSIUM: 4.8 mmol/L (ref 3.5–5.2)
Sodium: 141 mmol/L (ref 134–144)
Total Protein: 6.2 g/dL (ref 6.0–8.5)

## 2013-07-31 LAB — NMR, LIPOPROFILE
CHOLESTEROL: 151 mg/dL (ref <200)
HDL Cholesterol by NMR: 66 mg/dL (ref >=40)
HDL Particle Number: 28.5 umol/L — ABNORMAL LOW (ref >=30.5)
LDL PARTICLE NUMBER: 633 nmol/L (ref <1000)
LDL SIZE: 21.4 nm (ref >20.5)
LDLC SERPL CALC-MCNC: 69 mg/dL (ref <100)
TRIGLYCERIDES BY NMR: 78 mg/dL (ref <150)

## 2013-08-06 ENCOUNTER — Other Ambulatory Visit: Payer: Self-pay | Admitting: *Deleted

## 2013-08-06 MED ORDER — BACLOFEN 10 MG PO TABS: 5.0000 mg | ORAL_TABLET | Freq: Three times a day (TID) | ORAL | Status: DC

## 2013-08-12 ENCOUNTER — Telehealth: Payer: Self-pay | Admitting: Nurse Practitioner

## 2013-08-12 NOTE — Telephone Encounter (Signed)
Discussed results with patient's daughter. They will follow up as planned.

## 2013-09-07 ENCOUNTER — Telehealth: Payer: Self-pay | Admitting: Nurse Practitioner

## 2013-09-07 DIAGNOSIS — R066 Hiccough: Secondary | ICD-10-CM

## 2013-09-07 NOTE — Telephone Encounter (Signed)
Referral made 

## 2013-09-07 NOTE — Telephone Encounter (Signed)
They are wanting it for her hiccups because the meds aren't really working. They think that she should not go up and her med because she zombies out if she goes to high on the dosage please send in referral per her son.

## 2013-09-09 ENCOUNTER — Ambulatory Visit (INDEPENDENT_AMBULATORY_CARE_PROVIDER_SITE_OTHER)
Admission: RE | Admit: 2013-09-09 | Discharge: 2013-09-09 | Disposition: A | Discharge status: Home / Self Care | Payer: Medicare Other | Source: Ambulatory Visit | Attending: Internal Medicine | Admitting: Internal Medicine

## 2013-09-09 ENCOUNTER — Ambulatory Visit (INDEPENDENT_AMBULATORY_CARE_PROVIDER_SITE_OTHER): Payer: Medicare Other | Admitting: Internal Medicine

## 2013-09-09 ENCOUNTER — Encounter: Payer: Self-pay | Admitting: Internal Medicine

## 2013-09-09 VITALS — BP 128/70 | HR 76 | Ht 60.0 in | Wt 122.2 lb

## 2013-09-09 DIAGNOSIS — R066 Hiccough: Secondary | ICD-10-CM

## 2013-09-09 DIAGNOSIS — R911 Solitary pulmonary nodule: Secondary | ICD-10-CM | POA: Insufficient documentation

## 2013-09-09 DIAGNOSIS — J984 Other disorders of lung: Secondary | ICD-10-CM
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
## 2013-09-09 NOTE — Patient Instructions (Addendum)
Today we want you to go to the basement and get a Chest xray before leaving.  You have been scheduled for an Upper GI Series at [facility] . Your appointment is on 09/16/13 at 10:30. Please arrive 15 minutes prior to your test for registration. Make sure not to eat or drink anything after midnight on the night before your test. If you need to reschedule, please call radiology at [phone]. ________________________________________________________________ An upper GI series uses x rays to help diagnose problems of the upper GI tract, which includes the esophagus, stomach, and duodenum. The duodenum is the first part of the small intestine. An upper GI series is conducted by a radiology technologist or a radiologist-a doctor who specializes in x-ray imaging-at a hospital or outpatient center. While sitting or standing in front of an x-ray machine, the patient drinks barium liquid, which is often white and has a chalky consistency and taste. The barium liquid coats the lining of the upper GI tract and makes signs of disease show up more clearly on x rays. X-ray video, called fluoroscopy, is used to view the barium liquid moving through the esophagus, stomach, and duodenum. Additional x rays and fluoroscopy are performed while the patient lies on an x-ray table. To fully coat the upper GI tract with barium liquid, the technologist or radiologist may press on the abdomen or ask the patient to change position. Patients hold still in various positions, allowing the technologist or radiologist to take x rays of the upper GI tract at different angles. If a technologist conducts the upper GI series, a radiologist will later examine the images to look for problems.  This test typically takes about 1 hour to complete. __________________________________________________________________   I appreciate the opportunity to care for you.

## 2013-09-09 NOTE — Progress Notes (Signed)
Quick Note:  CXR shows abnormal area on lung  Needs unenhanced chest CT - would try to do that before UGI series ______

## 2013-09-10 ENCOUNTER — Other Ambulatory Visit: Payer: Self-pay

## 2013-09-10 DIAGNOSIS — R9389 Abnormal findings on diagnostic imaging of other specified body structures: Secondary | ICD-10-CM

## 2013-09-15 ENCOUNTER — Ambulatory Visit (INDEPENDENT_AMBULATORY_CARE_PROVIDER_SITE_OTHER)
Admission: RE | Admit: 2013-09-15 | Discharge: 2013-09-15 | Disposition: A | Discharge status: Home / Self Care | Payer: Medicare Other | Source: Ambulatory Visit | Attending: Internal Medicine | Admitting: Internal Medicine

## 2013-09-15 ENCOUNTER — Ambulatory Visit (HOSPITAL_COMMUNITY): Payer: Medicare Other

## 2013-09-15 DIAGNOSIS — R918 Other nonspecific abnormal finding of lung field: Secondary | ICD-10-CM

## 2013-09-15 DIAGNOSIS — R9389 Abnormal findings on diagnostic imaging of other specified body structures: Secondary | ICD-10-CM

## 2013-09-16 ENCOUNTER — Encounter: Payer: Self-pay | Admitting: Internal Medicine

## 2013-09-16 ENCOUNTER — Ambulatory Visit (HOSPITAL_COMMUNITY)
Admission: RE | Admit: 2013-09-16 | Discharge: 2013-09-16 | Disposition: A | Discharge status: Home / Self Care | Payer: Medicare Other | Source: Ambulatory Visit | Attending: Internal Medicine | Admitting: Internal Medicine

## 2013-09-16 DIAGNOSIS — R066 Hiccough: Secondary | ICD-10-CM

## 2013-09-16 HISTORY — DX: Hiccough: R06.6

## 2013-09-16 NOTE — Progress Notes (Signed)
Quick Note:  Share the good news - benign calcified nodule - not cancer Await upper GI series ______

## 2013-09-16 NOTE — Progress Notes (Signed)
Quick Note:  Xray showed some food still in stomach but is all ok I would not do other work-up at this time  Sorry but do not know why she has hiccups (what we are calling her problem, at least) and do not know how to help beyond what has been tried ______

## 2013-10-08 ENCOUNTER — Other Ambulatory Visit: Payer: Self-pay | Admitting: *Deleted

## 2013-10-08 DIAGNOSIS — N3281 Overactive bladder: Secondary | ICD-10-CM

## 2013-10-08 MED ORDER — SOLIFENACIN SUCCINATE 10 MG PO TABS: 10.0000 mg | ORAL_TABLET | Freq: Every day | ORAL | Status: DC

## 2013-10-13 ENCOUNTER — Telehealth: Payer: Self-pay | Admitting: Internal Medicine

## 2013-10-13 NOTE — Telephone Encounter (Signed)
I do not think that the barium had a therapeutic effect and am sorry but I do not have any new treatment recommendations.

## 2013-10-13 NOTE — Telephone Encounter (Signed)
Patient's daughter notified.

## 2013-10-13 NOTE — Telephone Encounter (Signed)
Daughter reports that after UGI she no longer had hiccups until a few days ago.  Daughter was wondering if the barium for test coated stopped the hiccups at least temporarily, and if there is something they can try for coating the GI tract?  She just wanted yoiur thoughts

## 2013-10-13 NOTE — Telephone Encounter (Signed)
Left message for patient to call back  

## 2013-10-14 ENCOUNTER — Telehealth: Payer: Self-pay | Admitting: *Deleted

## 2013-10-14 MED ORDER — SUCRALFATE 1 GM/10ML PO SUSP: 1.0000 g | Freq: Three times a day (TID) | ORAL | Status: DC

## 2013-10-14 NOTE — Telephone Encounter (Signed)
i left word for patsy regarding this med

## 2014-01-31 ENCOUNTER — Other Ambulatory Visit: Payer: Self-pay | Admitting: Nurse Practitioner

## 2014-02-01 ENCOUNTER — Encounter: Payer: Self-pay | Admitting: Nurse Practitioner

## 2014-02-01 ENCOUNTER — Ambulatory Visit (INDEPENDENT_AMBULATORY_CARE_PROVIDER_SITE_OTHER): Payer: Medicare Other | Admitting: Nurse Practitioner

## 2014-02-01 VITALS — BP 155/71 | HR 65 | Temp 97.3°F | Ht 60.0 in | Wt 121.6 lb

## 2014-02-01 DIAGNOSIS — I10 Essential (primary) hypertension: Secondary | ICD-10-CM

## 2014-02-01 DIAGNOSIS — E039 Hypothyroidism, unspecified: Secondary | ICD-10-CM

## 2014-02-01 DIAGNOSIS — N200 Calculus of kidney: Secondary | ICD-10-CM

## 2014-02-01 DIAGNOSIS — F0390 Unspecified dementia without behavioral disturbance: Secondary | ICD-10-CM

## 2014-02-01 DIAGNOSIS — K219 Gastro-esophageal reflux disease without esophagitis: Secondary | ICD-10-CM

## 2014-02-01 DIAGNOSIS — R066 Hiccough: Secondary | ICD-10-CM

## 2014-02-01 DIAGNOSIS — M21619 Bunion of unspecified foot: Secondary | ICD-10-CM

## 2014-02-01 DIAGNOSIS — L989 Disorder of the skin and subcutaneous tissue, unspecified: Secondary | ICD-10-CM

## 2014-02-01 DIAGNOSIS — M21612 Bunion of left foot: Secondary | ICD-10-CM

## 2014-02-01 MED ORDER — DONEPEZIL HCL 23 MG PO TABS: 23.0000 mg | ORAL_TABLET | Freq: Every day | ORAL | Status: DC

## 2014-02-01 MED ORDER — VALSARTAN 160 MG PO TABS: 160.0000 mg | ORAL_TABLET | Freq: Every day | ORAL | Status: DC

## 2014-02-01 MED ORDER — ESOMEPRAZOLE MAGNESIUM 20 MG PO CPDR: 20.0000 mg | DELAYED_RELEASE_CAPSULE | Freq: Every day | ORAL | Status: DC

## 2014-02-01 MED ORDER — SUCRALFATE 1 GM/10ML PO SUSP: 1.0000 g | Freq: Three times a day (TID) | ORAL | Status: DC

## 2014-02-01 MED ORDER — LEVOTHYROXINE SODIUM 75 MCG PO TABS: 75.0000 ug | ORAL_TABLET | Freq: Every day | ORAL | Status: DC

## 2014-02-01 NOTE — Progress Notes (Signed)
 Subjective:    Patient ID: Ruth Case, female    DOB: 10/13/1927, 80 y.o.   MRN: [id number]  HPI Patient in today for follow up. SHe is doing well. Memory is worsening some- She has intractable hiccups and has taken several meds which have not helped- she was up all night last night with hiccups. SHe is on aricept for her memory which is working well. Her daughter in law says that when she has a BM that it is hard little balls.  HS eis also c/o sore places on left foot and it hurts to weta her shoes.  Patient Active Problem List   Diagnosis Date Noted  . Intractable hiccups? 09/16/2013  . Solitary pulmonary nodule - calcified - suspect benign 09/09/2013  . Unspecified hypothyroidism 09/11/2012  . Essential hypertension, benign 09/11/2012  . Osteopenia 09/11/2012  . Nephrolithiasis 09/11/2012   Outpatient Encounter Prescriptions as of 02/01/2014  Medication Sig  . baclofen (LIORESAL) 10 MG tablet Take 0.5 tablets (5 mg total) by mouth 3 (three) times daily.  . donepezil (ARICEPT) 23 MG TABS tablet Take 1 tablet (23 mg total) by mouth at bedtime.  . esomeprazole (NEXIUM) 20 MG capsule Take 1 capsule (20 mg total) by mouth daily at 12 noon.  . levothyroxine (SYNTHROID, LEVOTHROID) 75 MCG tablet Take 1 tablet (75 mcg total) by mouth daily.  . solifenacin (VESICARE) 10 MG tablet Take 1 tablet (10 mg total) by mouth daily.  . sucralfate (CARAFATE) 1 GM/10ML suspension Take 10 mLs (1 g total) by mouth 3 (three) times daily before meals.  . valsartan (DIOVAN) 160 MG tablet Take 1 tablet (160 mg total) by mouth daily.  . [DISCONTINUED] ondansetron (ZOFRAN) 4 MG tablet Take 1 tablet (4 mg total) by mouth every 8 (eight) hours as needed for nausea.      Review of Systems  Constitutional: Negative.   HENT: Negative.   Respiratory: Negative.   Cardiovascular: Negative.   Genitourinary: Negative.   Neurological: Negative.   Psychiatric/Behavioral: Negative.   All other systems reviewed  and are negative.      Objective:   Physical Exam  Constitutional: She is oriented to person, place, and time. She appears well-developed and well-nourished.  HENT:  Nose: Nose normal.  Mouth/Throat: Oropharynx is clear and moist.  Eyes: EOM are normal.  Neck: Trachea normal, normal range of motion and full passive range of motion without pain. Neck supple. No JVD present. Carotid bruit is not present. No thyromegaly present.  Cardiovascular: Normal rate, regular rhythm, normal heart sounds and intact distal pulses.  Exam reveals no gallop and no friction rub.   No murmur heard. Pulmonary/Chest: Effort normal and breath sounds normal.  Constant hiccups  Abdominal: Soft. Bowel sounds are normal. She exhibits no distension and no mass. There is no tenderness.  Musculoskeletal: Normal range of motion.  Bunion on left foot  Lymphadenopathy:    She has no cervical adenopathy.  Neurological: She is alert and oriented to person, place, and time. She has normal reflexes.  Skin: Skin is warm and dry.  5cm raised scalp lesion- fluctulant  Psychiatric: She has a normal mood and affect. Her behavior is normal. Judgment and thought content normal.    BP 155/71  Pulse 65  Temp(Src) 97.3 F (36.3 C) (Oral)  Ht 5' (1.524 m)  Wt 121 lb 9.6 oz (55.157 kg)  BMI 23.75 kg/m2       Assessment & Plan:   1. Unspecified hypothyroidism   2.   Nephrolithiasis   3. Intractable hiccups?   4. Essential hypertension, benign   5. Bunion, left foot   6. Scalp lesion   7. Gastroesophageal reflux disease without esophagitis   8. Dementia, without behavioral disturbance    Orders Placed This Encounter  Procedures  . CMP14+EGFR  . NMR, lipoprofile  . Ambulatory referral to Dermatology    Referral Priority:  Routine    Referral Type:  Consultation    Referral Reason:  Specialty Services Required    Requested Specialty:  Dermatology    Number of Visits Requested:  1   Meds ordered this encounter    Medications  . valsartan (DIOVAN) 160 MG tablet    Sig: Take 1 tablet (160 mg total) by mouth daily.    Dispense:  30 tablet    Refill:  6    Order Specific Question:  Supervising Provider    Answer:  MOORE, DONALD W [[id number]]  . sucralfate (CARAFATE) 1 GM/10ML suspension    Sig: Take 10 mLs (1 g total) by mouth 3 (three) times daily before meals.    Dispense:  420 mL    Refill:  1    Order Specific Question:  Supervising Provider    Answer:  MOORE, DONALD W [[id number]]  . levothyroxine (SYNTHROID, LEVOTHROID) 75 MCG tablet    Sig: Take 1 tablet (75 mcg total) by mouth daily.    Dispense:  90 tablet    Refill:  3    Order Specific Question:  Supervising Provider    Answer:  MOORE, DONALD W [[id number]]  . esomeprazole (NEXIUM) 20 MG capsule    Sig: Take 1 capsule (20 mg total) by mouth daily at 12 noon.    Dispense:  30 capsule    Refill:  6    Order Specific Question:  Supervising Provider    Answer:  MOORE, DONALD W [[id number]]  . donepezil (ARICEPT) 23 MG TABS tablet    Sig: Take 1 tablet (23 mg total) by mouth at bedtime.    Dispense:  30 tablet    Refill:  5    Order Specific Question:  Supervising Provider    Answer:  MOORE, DONALD W [[id number]]    Labs pending Health maintenance reviewed Diet and exercise encouraged Continue all meds Follow up  In 3 month    Mary-Margaret , FNP    

## 2014-02-01 NOTE — Addendum Note (Signed)
Addended by: Bennie Pierini on: 02/01/2014 10:45 AM   Modules accepted: Orders

## 2014-02-01 NOTE — Patient Instructions (Signed)

## 2014-02-04 LAB — CMP14+EGFR
ALT: 17 IU/L (ref 0–32)
AST: 23 IU/L (ref 0–40)
Albumin/Globulin Ratio: 1.7 (ref 1.1–2.5)
Albumin: 4.2 g/dL (ref 3.5–4.7)
Alkaline Phosphatase: 109 IU/L (ref 39–117)
BUN/Creatinine Ratio: 14 (ref 11–26)
BUN: 15 mg/dL (ref 8–27)
CALCIUM: 9.3 mg/dL (ref 8.7–10.3)
CO2: 26 mmol/L (ref 18–29)
CREATININE: 1.06 mg/dL — AB (ref 0.57–1.00)
Chloride: 97 mmol/L (ref 97–108)
GFR calc Af Amer: 56 mL/min/{1.73_m2} — ABNORMAL LOW (ref >59)
GFR calc non Af Amer: 48 mL/min/{1.73_m2} — ABNORMAL LOW (ref >59)
Globulin, Total: 2.5 g/dL (ref 1.5–4.5)
Glucose: 78 mg/dL (ref 65–99)
POTASSIUM: 5 mmol/L (ref 3.5–5.2)
Sodium: 139 mmol/L (ref 134–144)
Total Bilirubin: 0.5 mg/dL (ref 0.0–1.2)
Total Protein: 6.7 g/dL (ref 6.0–8.5)

## 2014-02-04 LAB — NMR, LIPOPROFILE
Cholesterol: 169 mg/dL (ref 100–199)
HDL CHOLESTEROL BY NMR: 86 mg/dL (ref >39)
HDL Particle Number: 31.8 umol/L (ref >=30.5)
LDL Particle Number: 619 nmol/L (ref <1000)
LDL Size: 21 nm (ref >20.5)
LDLC SERPL CALC-MCNC: 71 mg/dL (ref 0–99)
LP-IR Score: <25 (ref <=45)
Small LDL Particle Number: <90 nmol/L (ref <=527)
Triglycerides by NMR: 62 mg/dL (ref 0–149)

## 2014-02-08 ENCOUNTER — Encounter: Payer: Self-pay | Admitting: *Deleted

## 2014-02-25 ENCOUNTER — Other Ambulatory Visit: Payer: Self-pay | Admitting: Pharmacist

## 2014-02-25 MED ORDER — IPRATROPIUM BROMIDE HFA 17 MCG/ACT IN AERS: 2.0000 | INHALATION_SPRAY | Freq: Four times a day (QID) | RESPIRATORY_TRACT | Status: DC | PRN

## 2014-02-26 ENCOUNTER — Other Ambulatory Visit: Payer: Self-pay | Admitting: *Deleted

## 2014-02-26 MED ORDER — IPRATROPIUM BROMIDE 0.03 % NA SOLN: 1.0000 | Freq: Two times a day (BID) | NASAL | Status: DC

## 2014-02-26 NOTE — Telephone Encounter (Signed)
Ruth Case aware- per [facility] atrovent nose spray

## 2014-03-31 ENCOUNTER — Ambulatory Visit (INDEPENDENT_AMBULATORY_CARE_PROVIDER_SITE_OTHER): Payer: Medicare Other

## 2014-03-31 DIAGNOSIS — Z23 Encounter for immunization: Secondary | ICD-10-CM

## 2014-05-10 ENCOUNTER — Ambulatory Visit (INDEPENDENT_AMBULATORY_CARE_PROVIDER_SITE_OTHER): Payer: Medicare Other | Admitting: Nurse Practitioner

## 2014-05-10 ENCOUNTER — Encounter: Payer: Self-pay | Admitting: Nurse Practitioner

## 2014-05-10 VITALS — BP 143/77 | HR 65 | Temp 96.5°F | Ht 60.0 in | Wt 125.0 lb

## 2014-05-10 DIAGNOSIS — F0390 Unspecified dementia without behavioral disturbance: Secondary | ICD-10-CM

## 2014-05-10 DIAGNOSIS — R066 Hiccough: Secondary | ICD-10-CM

## 2014-05-10 DIAGNOSIS — E039 Hypothyroidism, unspecified: Secondary | ICD-10-CM

## 2014-05-10 DIAGNOSIS — M8580 Other specified disorders of bone density and structure, unspecified site: Secondary | ICD-10-CM

## 2014-05-10 DIAGNOSIS — I10 Essential (primary) hypertension: Secondary | ICD-10-CM

## 2014-05-10 MED ORDER — BACLOFEN 10 MG PO TABS: 5.0000 mg | ORAL_TABLET | Freq: Three times a day (TID) | ORAL | Status: DC

## 2014-05-10 MED ORDER — LEVOTHYROXINE SODIUM 75 MCG PO TABS: 75.0000 ug | ORAL_TABLET | Freq: Every day | ORAL | Status: DC

## 2014-05-10 MED ORDER — SOLIFENACIN SUCCINATE 10 MG PO TABS: ORAL_TABLET | ORAL | Status: DC

## 2014-05-10 MED ORDER — FLUTICASONE PROPIONATE 50 MCG/ACT NA SUSP: 2.0000 | Freq: Every day | NASAL | Status: DC

## 2014-05-10 MED ORDER — DONEPEZIL HCL 23 MG PO TABS: 23.0000 mg | ORAL_TABLET | Freq: Every day | ORAL | Status: DC

## 2014-05-10 MED ORDER — VALSARTAN 160 MG PO TABS: 160.0000 mg | ORAL_TABLET | Freq: Every day | ORAL | Status: DC

## 2014-05-10 NOTE — Patient Instructions (Signed)

## 2014-05-10 NOTE — Progress Notes (Signed)
Subjective:    Patient ID: Ruth Case, female    DOB: 29-Jun-1927, 80 y.o.   MRN: [id number]  HPI Patient is in today for follow up- Her daughter in law is with her today. They she is doing wee. Memory is about the same- only complaint today is runny nose.  Patient Active Problem List   Diagnosis Date Noted  . Intractable hiccups? 09/16/2013  . Solitary pulmonary nodule - calcified - suspect benign 09/09/2013  . Unspecified hypothyroidism 09/11/2012  . Essential hypertension, benign 09/11/2012  . Osteopenia 09/11/2012  . Nephrolithiasis 09/11/2012   Outpatient Encounter Prescriptions as of 05/10/2014  Medication Sig  . baclofen (LIORESAL) 10 MG tablet Take 0.5 tablets (5 mg total) by mouth 3 (three) times daily.  Marland Kitchen donepezil (ARICEPT) 23 MG TABS tablet Take 1 tablet (23 mg total) by mouth at bedtime.  Marland Kitchen ipratropium (ATROVENT HFA) 17 MCG/ACT inhaler Inhale 2 puffs into the lungs every 6 (six) hours as needed for wheezing.  Marland Kitchen ipratropium (ATROVENT) 0.03 % nasal spray Place 1 spray into both nostrils every 12 (twelve) hours.  Marland Kitchen levothyroxine (SYNTHROID, LEVOTHROID) 75 MCG tablet Take 1 tablet (75 mcg total) by mouth daily.  . valsartan (DIOVAN) 160 MG tablet Take 1 tablet (160 mg total) by mouth daily.  . VESICARE 10 MG tablet TAKE 1 TABLET (10 MG TOTAL) BY MOUTH DAILY.  . [DISCONTINUED] esomeprazole (NEXIUM) 20 MG capsule Take 1 capsule (20 mg total) by mouth daily at 12 noon.       Review of Systems  Constitutional: Negative.   HENT: Positive for congestion and rhinorrhea.   Respiratory: Negative.   Cardiovascular: Negative.   Gastrointestinal: Negative.   Genitourinary: Negative.   Neurological: Negative.   Psychiatric/Behavioral: Negative.   All other systems reviewed and are negative.      Objective:   Physical Exam  Constitutional: She appears well-developed and well-nourished.  HENT:  Nose: Nose normal.  Mouth/Throat: Oropharynx is clear and moist.  Eyes:  EOM are normal.  Neck: Trachea normal, normal range of motion and full passive range of motion without pain. Neck supple. No JVD present. Carotid bruit is not present. No thyromegaly present.  Cardiovascular: Normal rate, regular rhythm and intact distal pulses.  Exam reveals no gallop and no friction rub.   Murmur (3/6 systolic murmur) heard. Pulmonary/Chest: Effort normal and breath sounds normal.  Abdominal: Soft. Bowel sounds are normal. She exhibits no distension and no mass. There is no tenderness.  Musculoskeletal: Normal range of motion.  Lymphadenopathy:    She has no cervical adenopathy.  Neurological: She is alert. She has normal reflexes.  Skin: Skin is warm and dry.  Psychiatric: She has a normal mood and affect. Her behavior is normal. Judgment and thought content normal.    BP 143/77 mmHg  Pulse 65  Temp(Src) 96.5 F (35.8 C) (Oral)  Ht 5' (1.524 m)  Wt 125 lb (56.7 kg)  BMI 24.41 kg/m2       Assessment & Plan:   1. Essential hypertension, benign   2. Hypothyroidism, unspecified hypothyroidism type   3. Osteopenia   4. Intractable hiccups?   5. Dementia, without behavioral disturbance    Meds ordered this encounter  Medications  . donepezil (ARICEPT) 23 MG TABS tablet    Sig: Take 1 tablet (23 mg total) by mouth at bedtime.    Dispense:  30 tablet    Refill:  5    Order Specific Question:  Supervising Provider  Answer:  Chipper Herb [[id number]]  . valsartan (DIOVAN) 160 MG tablet    Sig: Take 1 tablet (160 mg total) by mouth daily.    Dispense:  30 tablet    Refill:  6    Order Specific Question:  Supervising Provider    Answer:  Chipper Herb [[id number]]  . levothyroxine (SYNTHROID, LEVOTHROID) 75 MCG tablet    Sig: Take 1 tablet (75 mcg total) by mouth daily.    Dispense:  90 tablet    Refill:  3    Order Specific Question:  Supervising Provider    Answer:  Chipper Herb [[id number]]  . baclofen (LIORESAL) 10 MG tablet    Sig: Take 0.5 tablets (5 mg  total) by mouth 3 (three) times daily.    Dispense:  90 each    Refill:  1    Order Specific Question:  Supervising Provider    Answer:  Chipper Herb [[id number]]  . solifenacin (VESICARE) 10 MG tablet    Sig: TAKE 1 TABLET (10 MG TOTAL) BY MOUTH DAILY.    Dispense:  30 tablet    Refill:  3    Order Specific Question:  Supervising Provider    Answer:  Chipper Herb [[id number]]  . fluticasone (FLONASE) 50 MCG/ACT nasal spray    Sig: Place 2 sprays into both nostrils daily.    Dispense:  16 g    Refill:  6    Order Specific Question:  Supervising Provider    Answer:  Chipper Herb [[id number]]   Orders Placed This Encounter  Procedures  . CMP14+EGFR  . NMR, lipoprofile  . Thyroid Panel With TSH   Health maintenance reviewed Labs pending RTO in 3 months  [place], FNP

## 2014-05-11 LAB — NMR, LIPOPROFILE
CHOLESTEROL: 174 mg/dL (ref 100–199)
HDL Cholesterol by NMR: 93 mg/dL (ref >39)
HDL Particle Number: 35.3 umol/L (ref >=30.5)
LDL PARTICLE NUMBER: 620 nmol/L (ref <1000)
LDL Size: 21.5 nm (ref >20.5)
LDL-C: 68 mg/dL (ref 0–99)
LP-IR Score: <25 (ref <=45)
TRIGLYCERIDES BY NMR: 64 mg/dL (ref 0–149)

## 2014-05-11 LAB — CMP14+EGFR
A/G RATIO: 1.5 (ref 1.1–2.5)
ALBUMIN: 4.1 g/dL (ref 3.5–4.7)
ALT: 14 IU/L (ref 0–32)
AST: 19 IU/L (ref 0–40)
Alkaline Phosphatase: 106 IU/L (ref 39–117)
BILIRUBIN TOTAL: 0.5 mg/dL (ref 0.0–1.2)
BUN/Creatinine Ratio: 14 (ref 11–26)
BUN: 16 mg/dL (ref 8–27)
CO2: 25 mmol/L (ref 18–29)
Calcium: 9.8 mg/dL (ref 8.7–10.3)
Chloride: 98 mmol/L (ref 97–108)
Creatinine, Ser: 1.17 mg/dL — ABNORMAL HIGH (ref 0.57–1.00)
GFR calc Af Amer: 49 mL/min/{1.73_m2} — ABNORMAL LOW (ref >59)
GFR, EST NON AFRICAN AMERICAN: 43 mL/min/{1.73_m2} — AB (ref >59)
Globulin, Total: 2.7 g/dL (ref 1.5–4.5)
Glucose: 88 mg/dL (ref 65–99)
Potassium: 5.4 mmol/L — ABNORMAL HIGH (ref 3.5–5.2)
Sodium: 136 mmol/L (ref 134–144)
TOTAL PROTEIN: 6.8 g/dL (ref 6.0–8.5)

## 2014-05-11 LAB — THYROID PANEL WITH TSH
Free Thyroxine Index: 2.9 (ref 1.2–4.9)
T3 UPTAKE RATIO: 32 % (ref 24–39)
T4, Total: 9.1 ug/dL (ref 4.5–12.0)
TSH: 3.86 u[IU]/mL (ref 0.450–4.500)

## 2014-08-19 ENCOUNTER — Encounter: Payer: Self-pay | Admitting: Nurse Practitioner

## 2014-08-19 ENCOUNTER — Ambulatory Visit (INDEPENDENT_AMBULATORY_CARE_PROVIDER_SITE_OTHER): Payer: Medicare Other | Admitting: Nurse Practitioner

## 2014-08-19 VITALS — BP 152/82 | HR 71 | Temp 97.9°F | Wt 134.0 lb

## 2014-08-19 DIAGNOSIS — N3000 Acute cystitis without hematuria: Secondary | ICD-10-CM

## 2014-08-19 DIAGNOSIS — R110 Nausea: Secondary | ICD-10-CM | POA: Diagnosis not present

## 2014-08-19 DIAGNOSIS — R531 Weakness: Secondary | ICD-10-CM | POA: Diagnosis not present

## 2014-08-19 DIAGNOSIS — K529 Noninfective gastroenteritis and colitis, unspecified: Secondary | ICD-10-CM

## 2014-08-19 LAB — POCT URINALYSIS DIPSTICK
Bilirubin, UA: NEGATIVE
GLUCOSE UA: NEGATIVE
Ketones, UA: NEGATIVE
NITRITE UA: POSITIVE
PROTEIN UA: NEGATIVE
SPEC GRAV UA: 1.01
UROBILINOGEN UA: NEGATIVE
pH, UA: 5

## 2014-08-19 LAB — POCT UA - MICROSCOPIC ONLY
CASTS, UR, LPF, POC: NEGATIVE
CRYSTALS, UR, HPF, POC: NEGATIVE
Mucus, UA: NEGATIVE
YEAST UA: NEGATIVE

## 2014-08-19 LAB — GLUCOSE, POCT (MANUAL RESULT ENTRY): POC GLUCOSE: 90 mg/dL (ref 70–99)

## 2014-08-19 MED ORDER — CIPROFLOXACIN HCL 500 MG PO TABS: 500.0000 mg | ORAL_TABLET | Freq: Two times a day (BID) | ORAL | Status: DC

## 2014-08-19 NOTE — Progress Notes (Addendum)
Subjective:    Patient ID: Ruth Case, female    DOB: 1927-11-21, 81 y.o.   MRN: [id number]  HPI Patient brought in by her daughter in law with c/o of feeling shaky and weak- started earlier today. SHe denies fever or resp symptoms. No urinary symptoms. Says that she has not eaten all day because she just doesn't want to. Headache started 3-4 hours ago.    Review of Systems  Constitutional: Positive for appetite change. Negative for fever.  HENT: Negative.   Respiratory: Negative.   Cardiovascular: Negative.   Gastrointestinal: Positive for nausea. Negative for vomiting, diarrhea and constipation.  Genitourinary: Negative.   Psychiatric/Behavioral: Negative.   All other systems reviewed and are negative.      Objective:   Physical Exam  Constitutional: She is oriented to person, place, and time. She appears well-developed and well-nourished. No distress.  Cardiovascular: Normal rate, regular rhythm and normal heart sounds.   Pulmonary/Chest: Effort normal and breath sounds normal.  Abdominal: Soft. She exhibits no distension and no mass. There is no tenderness. There is no rebound and no guarding.  Neurological: She is alert and oriented to person, place, and time. She has normal reflexes. No cranial nerve deficit.  Skin: Skin is warm and dry.  Psychiatric: She has a normal mood and affect. Her behavior is normal. Judgment and thought content normal.    BP 152/82 mmHg  Pulse 71  Temp(Src) 97.9 F (36.6 C) (Oral)  Wt 134 lb (60.782 kg)  Results for orders placed or performed in visit on 08/19/14  POCT UA - Microscopic Only  Result Value Ref Range   WBC, Ur, HPF, POC 10-12    RBC, urine, microscopic occ    Bacteria, U Microscopic many    Mucus, UA negative    Epithelial cells, urine per micros few    Crystals, Ur, HPF, POC negative    Casts, Ur, LPF, POC negative    Yeast, UA negative   POCT urinalysis dipstick  Result Value Ref Range   Color, UA gold    Clarity, UA sl cloudy    Glucose, UA negative    Bilirubin, UA negative    Ketones, UA negative    Spec Grav, UA 1.010    Blood, UA large    pH, UA 5.0    Protein, UA negative    Urobilinogen, UA negative    Nitrite, UA positive    Leukocytes, UA large (3+)   POCT glucose (manual entry)  Result Value Ref Range   POC Glucose 90 70 - 99 mg/dl           Assessment & Plan:  1. Nausea without vomiting - POCT UA - Microscopic Only - POCT urinalysis dipstick - POCT glucose (manual entry)  2. Weakness - POCT UA - Microscopic Only - POCT urinalysis dipstick - POCT glucose (manual entry)  3. Acute cystitis without hematuria -cipro  1 po BID #20 0 refills Take medication as prescribe Cotton underwear Take shower not bath Cranberry juice, yogurt Force fluids AZO over the counter X2 days Culture pending RTO prn   4. Gastroenteritis First 24 Hours-Clear liquids  popsicles  Jello  gatorade  Sprite Second 24 hours-Add Full liquids ( Liquids you cant see through) Third 24 hours- Bland diet ( foods that are baked or broiled)  *avoiding fried foods and highly spiced foods* During these 3 days  Avoid milk, cheese, ice cream or any other dairy products  Avoid caffeine- REMEMBER Mt. Dew  and Mello Yellow contain lots of caffeine You should eat and drink in  Frequent small volumes If no improvement in symptoms or worsen in 2-3 days should RETRUN TO OFFICE or go to ER!    Mary-Margaret Daphine DeutscherMartin, FNP

## 2014-08-19 NOTE — Patient Instructions (Signed)

## 2014-08-20 NOTE — Addendum Note (Signed)
Addended by: Prescott GumLAND, Nico Rogness M on: 08/20/2014 09:05 AM   Modules accepted: Orders

## 2014-08-22 LAB — URINE CULTURE

## 2014-08-23 ENCOUNTER — Other Ambulatory Visit: Payer: Self-pay | Admitting: Nurse Practitioner

## 2014-09-30 ENCOUNTER — Other Ambulatory Visit: Payer: Self-pay | Admitting: Nurse Practitioner

## 2014-10-01 ENCOUNTER — Other Ambulatory Visit: Payer: Self-pay | Admitting: Nurse Practitioner

## 2014-11-15 ENCOUNTER — Ambulatory Visit: Payer: Medicare Other | Admitting: Nurse Practitioner

## 2014-12-01 ENCOUNTER — Ambulatory Visit (INDEPENDENT_AMBULATORY_CARE_PROVIDER_SITE_OTHER): Payer: Medicare Other | Admitting: Nurse Practitioner

## 2014-12-01 ENCOUNTER — Ambulatory Visit (INDEPENDENT_AMBULATORY_CARE_PROVIDER_SITE_OTHER): Payer: Medicare Other

## 2014-12-01 ENCOUNTER — Encounter: Payer: Self-pay | Admitting: Nurse Practitioner

## 2014-12-01 VITALS — BP 136/84 | HR 72 | Temp 98.3°F | Ht 60.0 in | Wt 141.6 lb

## 2014-12-01 DIAGNOSIS — M8580 Other specified disorders of bone density and structure, unspecified site: Secondary | ICD-10-CM

## 2014-12-01 DIAGNOSIS — R0989 Other specified symptoms and signs involving the circulatory and respiratory systems: Secondary | ICD-10-CM

## 2014-12-01 DIAGNOSIS — I10 Essential (primary) hypertension: Secondary | ICD-10-CM

## 2014-12-01 DIAGNOSIS — R300 Dysuria: Secondary | ICD-10-CM | POA: Diagnosis not present

## 2014-12-01 DIAGNOSIS — E039 Hypothyroidism, unspecified: Secondary | ICD-10-CM | POA: Diagnosis not present

## 2014-12-01 DIAGNOSIS — F0390 Unspecified dementia without behavioral disturbance: Secondary | ICD-10-CM

## 2014-12-01 LAB — POCT URINALYSIS DIPSTICK
BILIRUBIN UA: NEGATIVE
GLUCOSE UA: NEGATIVE
KETONES UA: NEGATIVE
Leukocytes, UA: NEGATIVE
NITRITE UA: NEGATIVE
PH UA: 7.5
UROBILINOGEN UA: NEGATIVE

## 2014-12-01 LAB — POCT UA - MICROSCOPIC ONLY
BACTERIA, U MICROSCOPIC: NEGATIVE
CASTS, UR, LPF, POC: NEGATIVE
Crystals, Ur, HPF, POC: NEGATIVE
MUCUS UA: NEGATIVE
Yeast, UA: NEGATIVE

## 2014-12-01 MED ORDER — VALSARTAN 160 MG PO TABS: 160.0000 mg | ORAL_TABLET | Freq: Every day | ORAL | Status: DC

## 2014-12-01 MED ORDER — DONEPEZIL HCL 23 MG PO TABS: 23.0000 mg | ORAL_TABLET | Freq: Every day | ORAL | Status: DC

## 2014-12-01 NOTE — Progress Notes (Signed)
Subjective:    Patient ID: Ruth Case, female    DOB: Jul 16, 1927, 81 y.o.   MRN: [id number]   Patient here today for follow up of chronic medical problems.   Hypertension This is a chronic problem. The current episode started more than 1 year ago. The problem is unchanged. The problem is controlled. Pertinent negatives include no chest pain, headaches, malaise/fatigue or palpitations. Risk factors for coronary artery disease include dyslipidemia, post-menopausal state and sedentary lifestyle. Past treatments include angiotensin blockers. The current treatment provides mild improvement. Hypertensive end-organ damage includes a thyroid problem. There is no history of CAD/MI or CVA.  Thyroid Problem Presents for follow-up (hypothyroidism) visit. Patient reports no palpitations. The symptoms have been stable.  urinary urgency vesicare 71m daily helps but still has occasional urgency. Has had some dysuira recently and her gait has slowed- last time she was moving  This slow she had a UTI. Memory disturbance aricept daily- family has noticed a significant increase in dementia- she is getting where she has trouble having a conversation- however she still lives by herself. Chronic hiccups They have tried everything and nothing helps.    Review of Systems  Constitutional: Negative.  Negative for malaise/fatigue.  HENT: Negative.   Respiratory: Negative.   Cardiovascular: Negative.  Negative for chest pain and palpitations.  Genitourinary: Negative.   Neurological: Negative.  Negative for headaches.  Psychiatric/Behavioral: Negative.   All other systems reviewed and are negative.      Objective:   Physical Exam  Constitutional: She appears well-developed and well-nourished.  HENT:  Nose: Nose normal.  Mouth/Throat: Oropharynx is clear and moist.  Eyes: EOM are normal.  Neck: Trachea normal, normal range of motion and full passive range of motion without pain. Neck supple. No JVD  present. Carotid bruit is not present. No thyromegaly present.  Cardiovascular: Normal rate, regular rhythm, normal heart sounds and intact distal pulses.  Exam reveals no gallop and no friction rub.   No murmur heard. Pulmonary/Chest: Effort normal. She has rales (bibasillar).  Abdominal: Soft. Bowel sounds are normal. She exhibits no distension and no mass. There is no tenderness.  Musculoskeletal: Normal range of motion.  Lymphadenopathy:    She has no cervical adenopathy.  Neurological: She is alert. She has normal reflexes. No cranial nerve deficit.  Skin: Skin is warm and dry.  Psychiatric: She has a normal mood and affect. Her behavior is normal. Judgment and thought content normal.   BP 136/84 mmHg  Pulse 72  Temp(Src) 98.3 F (36.8 C) (Oral)  Ht 5' (1.524 m)  Wt 141 lb 9.6 oz (64.229 kg)  BMI 27.65 kg/m2   Chest x ray- chronic bronchitic changes - increased elevated hemidiaphragm with right atelectisis and increasing cardiomegally  Results for orders placed or performed in visit on 12/01/14  POCT UA - Microscopic Only  Result Value Ref Range   WBC, Ur, HPF, POC occ    RBC, urine, microscopic 1-3    Bacteria, U Microscopic neg    Mucus, UA neg    Epithelial cells, urine per micros occ    Crystals, Ur, HPF, POC neg    Casts, Ur, LPF, POC neg    Yeast, UA neg   POCT urinalysis dipstick  Result Value Ref Range   Color, UA yellow    Clarity, UA clear    Glucose, UA neg    Bilirubin, UA neg    Ketones, UA neg    Spec Grav, UA <=1.005  Blood, UA small    pH, UA 7.5    Protein, UA trace    Urobilinogen, UA negative    Nitrite, UA neg    Leukocytes, UA Negative Negative          Assessment & Plan:  1. Essential hypertension, benign Do not add salt to diet - CMP14+EGFR - Lipid panel  2. Hypothyroidism, unspecified hypothyroidism type - Thyroid Panel With TSH  3. Osteopenia Cannot due weight bearing exercises due to unsteady balance  4. Dementia,  without behavioral disturbance - donepezil (ARICEPT) 23 MG TABS tablet; Take 1 tablet (23 mg total) by mouth at bedtime.  Dispense: 30 tablet; Refill: 5  5. Dysuria Urine clear - POCT UA - Microscopic Only - POCT urinalysis dipstick  6. Rales Chest xray done - DG Chest 2 View; Future    Labs pending Health maintenance reviewed Diet and exercise encouraged Continue all meds Follow up  In 3 months   [place], FNP

## 2014-12-01 NOTE — Patient Instructions (Signed)

## 2014-12-14 ENCOUNTER — Telehealth: Payer: Self-pay | Admitting: Nurse Practitioner

## 2014-12-14 MED ORDER — MIRTAZAPINE 15 MG PO TABS: 15.0000 mg | ORAL_TABLET | Freq: Every day | ORAL | Status: DC

## 2014-12-14 NOTE — Telephone Encounter (Signed)
remeron sent to pharamacy= see if helps

## 2014-12-14 NOTE — Telephone Encounter (Signed)
Aware of new script and is thankful to the provider (MMM), for her help.

## 2015-01-29 ENCOUNTER — Other Ambulatory Visit: Payer: Self-pay | Admitting: Nurse Practitioner

## 2015-01-31 ENCOUNTER — Ambulatory Visit (INDEPENDENT_AMBULATORY_CARE_PROVIDER_SITE_OTHER): Payer: Medicare Other | Admitting: *Deleted

## 2015-01-31 DIAGNOSIS — Z111 Encounter for screening for respiratory tuberculosis: Secondary | ICD-10-CM

## 2015-01-31 NOTE — Progress Notes (Signed)
Patient tolerated well.

## 2015-01-31 NOTE — Patient Instructions (Signed)

## 2015-02-01 ENCOUNTER — Telehealth: Payer: Self-pay | Admitting: *Deleted

## 2015-02-01 NOTE — Telephone Encounter (Signed)
Pt takes remeron at night. She is only sleeping about 3 hours a night. Can we add a sleeping pill or what do you sugguest. When she doesn't sleep she has anxiety bad the next day! Call [name]

## 2015-02-02 ENCOUNTER — Encounter: Payer: Self-pay | Admitting: *Deleted

## 2015-02-02 ENCOUNTER — Other Ambulatory Visit: Payer: Self-pay | Admitting: Nurse Practitioner

## 2015-02-02 LAB — TB SKIN TEST
Induration: 0 mm
TB Skin Test: NEGATIVE

## 2015-02-02 MED ORDER — MIRTAZAPINE 30 MG PO TABS
30.0000 mg | ORAL_TABLET | Freq: Every day | ORAL | Status: DC
Start: 2015-02-02 — End: 2015-06-07

## 2015-02-02 NOTE — Telephone Encounter (Signed)
I am really afraid to medicate her more because i am afraid it will make her dizzy and if she gets up during night she may fall an break hip or wrist or something.

## 2015-02-02 NOTE — Telephone Encounter (Signed)
Aware new Rx sent in for Remeron .

## 2015-02-02 NOTE — Telephone Encounter (Signed)
Increased remeron to 30 mg qhs and see how she does- rx sent to pharmacy

## 2015-02-02 NOTE — Telephone Encounter (Signed)
Called back to [place]. She understands risk of dizziness. Can pt's Remeron be increased? There are family members who stay with her, she is not by herself. She will be going to [place] nxt week 9/21. [name] states that she is just less anxious, better state of mind if she is able to sleep at night.

## 2015-02-23 ENCOUNTER — Other Ambulatory Visit: Payer: Medicare Other

## 2015-02-23 NOTE — Addendum Note (Signed)
Addended by: Tommas Olp on: 02/23/2015 03:51 PM   Modules accepted: Orders

## 2015-02-23 NOTE — Addendum Note (Signed)
Addended by: Tommas Olp on: 02/23/2015 03:46 PM   Modules accepted: Orders

## 2015-02-24 LAB — URINE CULTURE

## 2015-03-01 ENCOUNTER — Telehealth: Payer: Self-pay | Admitting: Nurse Practitioner

## 2015-03-01 NOTE — Telephone Encounter (Signed)
Done

## 2015-03-11 ENCOUNTER — Ambulatory Visit (INDEPENDENT_AMBULATORY_CARE_PROVIDER_SITE_OTHER): Payer: Medicare Other | Admitting: Pediatrics

## 2015-03-11 ENCOUNTER — Ambulatory Visit (INDEPENDENT_AMBULATORY_CARE_PROVIDER_SITE_OTHER): Payer: Medicare Other

## 2015-03-11 ENCOUNTER — Encounter: Payer: Self-pay | Admitting: Pediatrics

## 2015-03-11 VITALS — BP 98/59 | HR 75 | Temp 100.6°F | Ht 60.0 in | Wt 143.4 lb

## 2015-03-11 DIAGNOSIS — J189 Pneumonia, unspecified organism: Secondary | ICD-10-CM | POA: Diagnosis not present

## 2015-03-11 DIAGNOSIS — I10 Essential (primary) hypertension: Secondary | ICD-10-CM | POA: Diagnosis not present

## 2015-03-11 DIAGNOSIS — R059 Cough, unspecified: Secondary | ICD-10-CM

## 2015-03-11 DIAGNOSIS — I959 Hypotension, unspecified: Secondary | ICD-10-CM

## 2015-03-11 DIAGNOSIS — R05 Cough: Secondary | ICD-10-CM

## 2015-03-11 DIAGNOSIS — H109 Unspecified conjunctivitis: Secondary | ICD-10-CM | POA: Diagnosis not present

## 2015-03-11 NOTE — Progress Notes (Signed)
Subjective:    Patient ID: Ruth Case, female    DOB: 03-19-1928, 81 y.o.   MRN: [id number]  CC: cough, pink eye  HPI: Ruth Case is a 81 y.o. female presenting on 03/11/2015 for Cough and Nasal Congestion  Red R eye, coughing for past few days. Pt with dementia, most questions answered by her DIL Ruth Case who is here with her. Pt lives in SNF now. Has been coughing up green sputum. No recent episodes of pneumonia No problems with her breathing usually Pt says her appetite has been fine, DIL is not sure Pt denies anything else hurting No known fevers at home  Relevant past medical, surgical, family and social history reviewed and updated as indicated. Interim medical history since our last visit reviewed. Allergies and medications reviewed and updated.   ROS: Per HPI unless specifically indicated above  Past Medical History Patient Active Problem List   Diagnosis Date Noted  . Intractable hiccups? 09/16/2013  . Solitary pulmonary nodule - calcified - suspect benign 09/09/2013  . Hypothyroidism 09/11/2012  . Essential hypertension, benign 09/11/2012  . Osteopenia 09/11/2012  . Nephrolithiasis 09/11/2012    Current Outpatient Prescriptions  Medication Sig Dispense Refill  . donepezil (ARICEPT) 23 MG TABS tablet Take 1 tablet (23 mg total) by mouth at bedtime. 30 tablet 5  . fluticasone (FLONASE) 50 MCG/ACT nasal spray Place 2 sprays into both nostrils daily. 16 g 6  . levothyroxine (SYNTHROID, LEVOTHROID) 75 MCG tablet Take 1 tablet (75 mcg total) by mouth daily. 90 tablet 3  . mirtazapine (REMERON) 30 MG tablet Take 1 tablet (30 mg total) by mouth at bedtime. 30 tablet 1  . valsartan (DIOVAN) 160 MG tablet     . VESICARE 10 MG tablet TAKE 1 TABLET EVERY DAY 30 tablet 3   No current facility-administered medications for this visit.       Objective:    BP 98/59 mmHg  Pulse 75  Temp(Src) 100.6 F (38.1 C) (Oral)  Ht 5' (1.524 m)  Wt 143 lb 6.4 oz  (65.046 kg)  BMI 28.01 kg/m2  Wt Readings from Last 3 Encounters:  03/11/15 143 lb 6.4 oz (65.046 kg)  12/01/14 141 lb 9.6 oz (64.229 kg)  08/19/14 134 lb (60.782 kg)    Pulse ox 93-95% on room air   Gen: NAD, alert, cooperative with exam, NCAT EYES: EOMI, no scleral injection or icterus ENT:  TMs pearly gray b/l, OP without erythema LYMPH: no cervical LAD CV: NRRR, normal S1/S2, no murmur, distal pulses 2+ b/l Resp: crackles R base, normal WOB, moving air well Abd: +BS, soft, NTND. no guarding or organomegaly Ext: No edema, warm Neuro: Alert and oriented, strength equal b/l UE and LE, coordination grossly normal MSK: normal muscle bulk  Preliminary read by Rex Krasarol Vincent, MD: low lung volume, cardiomegaly, hilar vs RML opacity not able to see all of R diaphragm      Assessment & Plan:   Ruth Case was seen today for cough and nasal congestion, with temp to 100.6, normal pulse ox, with RML opacity on xray. Will treat with levofloxacin for 7 days for CAP. Wiping purulent material away from R eye regularly, will give abx eye drops as well. Pt's blood pressure low for her, gave SNF hold parameters for pt's one blood pressure medicine. RTC if worsening.  Diagnoses and all orders for this visit:  Cough -     DG Chest 2 View; Future  CAP (community acquired pneumonia)  Conjunctivitis  of right eye  Hypotension, unspecified hypotension type  Follow up plan: Return in about 3 weeks (around 04/01/2015).  Rex Kras, MD [facility] 03/11/2015, 2:41 PM

## 2015-03-31 ENCOUNTER — Ambulatory Visit (INDEPENDENT_AMBULATORY_CARE_PROVIDER_SITE_OTHER): Payer: Medicare Other

## 2015-03-31 DIAGNOSIS — Z23 Encounter for immunization: Secondary | ICD-10-CM | POA: Diagnosis not present

## 2015-04-04 ENCOUNTER — Encounter: Payer: Self-pay | Admitting: Pediatrics

## 2015-04-04 ENCOUNTER — Ambulatory Visit (INDEPENDENT_AMBULATORY_CARE_PROVIDER_SITE_OTHER): Payer: Medicare Other | Admitting: Pediatrics

## 2015-04-04 VITALS — BP 100/62 | HR 74 | Temp 97.1°F | Ht 60.0 in | Wt 140.2 lb

## 2015-04-04 DIAGNOSIS — I10 Essential (primary) hypertension: Secondary | ICD-10-CM

## 2015-04-04 DIAGNOSIS — J189 Pneumonia, unspecified organism: Secondary | ICD-10-CM

## 2015-04-04 MED ORDER — VALSARTAN 80 MG PO TABS: 80.0000 mg | ORAL_TABLET | Freq: Every day | ORAL | Status: DC

## 2015-04-04 NOTE — Progress Notes (Signed)
    Subjective:    Patient ID: Ruth Case, female    DOB: Oct 03, 1927, 81 y.o.   MRN: [id number]  CC: pneumonia f/u  HPI: Ruth Case is a 81 y.o. female with dementia, HTN presenting on 04/04/2015 for Follow-up  Seen two weeks ago, diagnosed with pneumonia, conjunctivitis. Pt now much improved after 7 days abx Back to her normal self per DIL Ruth Case who accompanies her here today Pt loves walking throughout the day, little time to sit and prop her feet up   Relevant past medical, surgical, family and social history reviewed and updated as indicated. Interim medical history since our last visit reviewed. Allergies and medications reviewed and updated.   ROS: Per HPI unless specifically indicated above  Past Medical History Patient Active Problem List   Diagnosis Date Noted  . Intractable hiccups? 09/16/2013  . Solitary pulmonary nodule - calcified - suspect benign 09/09/2013  . Hypothyroidism 09/11/2012  . Essential hypertension, benign 09/11/2012  . Osteopenia 09/11/2012  . Nephrolithiasis 09/11/2012    Current Outpatient Prescriptions  Medication Sig Dispense Refill  . donepezil (ARICEPT) 23 MG TABS tablet Take 1 tablet (23 mg total) by mouth at bedtime. 30 tablet 5  . fluticasone (FLONASE) 50 MCG/ACT nasal spray Place 2 sprays into both nostrils daily. 16 g 6  . levothyroxine (SYNTHROID, LEVOTHROID) 75 MCG tablet Take 1 tablet (75 mcg total) by mouth daily. 90 tablet 3  . mirtazapine (REMERON) 30 MG tablet Take 1 tablet (30 mg total) by mouth at bedtime. 30 tablet 1  . VESICARE 10 MG tablet TAKE 1 TABLET EVERY DAY 30 tablet 3  . valsartan (DIOVAN) 80 MG tablet Take 1 tablet (80 mg total) by mouth daily. 90 tablet 3   No current facility-administered medications for this visit.       Objective:    BP 100/62 mmHg  Pulse 74  Temp(Src) 97.1 F (36.2 C) (Oral)  Ht 5' (1.524 m)  Wt 140 lb 3.2 oz (63.594 kg)  BMI 27.38 kg/m2  Wt Readings from Last 3  Encounters:  04/04/15 140 lb 3.2 oz (63.594 kg)  03/11/15 143 lb 6.4 oz (65.046 kg)  12/01/14 141 lb 9.6 oz (64.229 kg)     Gen: NAD, alert, cooperative with exam, NCAT EYES: EOMI, no scleral injection or icterus ENT:  TMs pearly gray b/l, OP without erythema LYMPH: no cervical LAD CV: NRRR, normal S1/S2, no murmur, distal pulses 2+ b/l Resp: CTABL, no wheezes, normal WOB Abd: +BS, soft, NTND. no guarding or organomegaly Ext: No edema, warm Neuro: Alert, strength equal b/l UE and LE, coordination grossly normal     Assessment & Plan:   Ruth Case was seen today for follow-up pneumonia, exam normal today. BP remains borderline low, will decrease valsartan to 80mg  once a day. Orders written for SNF.  Diagnoses and all orders for this visit:  Essential hypertension -     valsartan (DIOVAN) 80 MG tablet; Take 1 tablet (80 mg total) by mouth daily.    Follow up plan: Return for as scheduled in 2 months.  Ruth Krasarol Vincent, MD [facility] 04/04/2015, 11:12 AM

## 2015-05-05 ENCOUNTER — Telehealth: Payer: Self-pay | Admitting: Family Medicine

## 2015-05-05 DIAGNOSIS — M549 Dorsalgia, unspecified: Secondary | ICD-10-CM

## 2015-05-05 NOTE — Telephone Encounter (Signed)
Ruth Case aware of patient's symptoms.  [facility]

## 2015-05-05 NOTE — Telephone Encounter (Signed)
Yes, agreed, please obtain UA and urine culture. Is her daughter-in-law aware of the change in her mental status? She needs to be seen if this does not improve.

## 2015-05-11 ENCOUNTER — Other Ambulatory Visit: Payer: Medicare Other

## 2015-05-11 DIAGNOSIS — R300 Dysuria: Secondary | ICD-10-CM

## 2015-05-11 NOTE — Telephone Encounter (Signed)
Family aware that we have not received a urine on patient

## 2015-05-12 LAB — URINE CULTURE

## 2015-06-07 ENCOUNTER — Encounter: Payer: Self-pay | Admitting: Nurse Practitioner

## 2015-06-07 ENCOUNTER — Ambulatory Visit (INDEPENDENT_AMBULATORY_CARE_PROVIDER_SITE_OTHER): Payer: Medicare Other | Admitting: Nurse Practitioner

## 2015-06-07 VITALS — BP 104/62 | HR 78 | Temp 97.7°F | Ht 60.0 in | Wt 138.0 lb

## 2015-06-07 DIAGNOSIS — R3915 Urgency of urination: Secondary | ICD-10-CM | POA: Diagnosis not present

## 2015-06-07 DIAGNOSIS — I10 Essential (primary) hypertension: Secondary | ICD-10-CM | POA: Diagnosis not present

## 2015-06-07 DIAGNOSIS — M8580 Other specified disorders of bone density and structure, unspecified site: Secondary | ICD-10-CM

## 2015-06-07 DIAGNOSIS — E039 Hypothyroidism, unspecified: Secondary | ICD-10-CM

## 2015-06-07 DIAGNOSIS — G4700 Insomnia, unspecified: Secondary | ICD-10-CM

## 2015-06-07 DIAGNOSIS — F0390 Unspecified dementia without behavioral disturbance: Secondary | ICD-10-CM

## 2015-06-07 MED ORDER — FLUTICASONE PROPIONATE 50 MCG/ACT NA SUSP: 2.0000 | Freq: Every day | NASAL | Status: DC

## 2015-06-07 MED ORDER — SOLIFENACIN SUCCINATE 10 MG PO TABS: 10.0000 mg | ORAL_TABLET | Freq: Every day | ORAL | Status: DC

## 2015-06-07 MED ORDER — VALSARTAN 80 MG PO TABS: 80.0000 mg | ORAL_TABLET | Freq: Every day | ORAL | Status: DC

## 2015-06-07 MED ORDER — DONEPEZIL HCL 23 MG PO TABS: 23.0000 mg | ORAL_TABLET | Freq: Every day | ORAL | Status: DC

## 2015-06-07 MED ORDER — MIRTAZAPINE 30 MG PO TABS: 30.0000 mg | ORAL_TABLET | Freq: Every day | ORAL | Status: DC

## 2015-06-07 MED ORDER — LEVOTHYROXINE SODIUM 75 MCG PO TABS: 75.0000 ug | ORAL_TABLET | Freq: Every day | ORAL | Status: DC

## 2015-06-07 NOTE — Addendum Note (Signed)
Addended by: Bennie Pierini on: 06/07/2015 11:08 AM   Modules accepted: Orders

## 2015-06-07 NOTE — Progress Notes (Signed)
Subjective:    Patient ID: Ruth Case, female    DOB: 07/01/1927, 82 y.o.   MRN: [id number]   Patient here today for follow up of chronic medical problems. Patient has moved into [facility] since  Last visit. Family says taht she has adjusted well. She seems to be nuch better now that she is in a routine and gets her meds and meals at the sametime dailiy.  Outpatient Encounter Prescriptions as of 06/07/2015  Medication Sig  . donepezil (ARICEPT) 23 MG TABS tablet Take 1 tablet (23 mg total) by mouth at bedtime.  . fluticasone (FLONASE) 50 MCG/ACT nasal spray Place 2 sprays into both nostrils daily.  Marland Kitchen levothyroxine (SYNTHROID, LEVOTHROID) 75 MCG tablet Take 1 tablet (75 mcg total) by mouth daily.  . mirtazapine (REMERON) 30 MG tablet Take 1 tablet (30 mg total) by mouth at bedtime.  . valsartan (DIOVAN) 80 MG tablet Take 1 tablet (80 mg total) by mouth daily.  . VESICARE 10 MG tablet TAKE 1 TABLET EVERY DAY   No facility-administered encounter medications on file as of 06/07/2015.    Hypertension This is a chronic problem. The current episode started more than 1 year ago. The problem is unchanged. The problem is controlled. Pertinent negatives include no chest pain, headaches, malaise/fatigue or palpitations. Risk factors for coronary artery disease include dyslipidemia, post-menopausal state and sedentary lifestyle. Past treatments include angiotensin blockers. The current treatment provides mild improvement. Hypertensive end-organ damage includes a thyroid problem. There is no history of CAD/MI or CVA.  Thyroid Problem Presents for follow-up (hypothyroidism) visit. Patient reports no palpitations. The symptoms have been stable.  urinary urgency vesicare 10mg  daily helps but still has occasional urgency. Has had some dysuira recently and her gait has slowed- last time she was moving  This slow she had a UTI. Memory disturbance aricept daily- family has noticed a significant increase  in dementia- she is getting where she has trouble having a conversation- however she still lives by herself. Chronic hiccups They have tried everything and nothing helps.    Review of Systems  Constitutional: Negative.  Negative for malaise/fatigue.  HENT: Negative.   Respiratory: Negative.   Cardiovascular: Negative.  Negative for chest pain and palpitations.  Genitourinary: Negative.   Neurological: Negative.  Negative for headaches.  Psychiatric/Behavioral: Negative.   All other systems reviewed and are negative.      Objective:   Physical Exam  Constitutional: She appears well-developed and well-nourished.  HENT:  Nose: Nose normal.  Mouth/Throat: Oropharynx is clear and moist.  Eyes: EOM are normal.  Neck: Trachea normal, normal range of motion and full passive range of motion without pain. Neck supple. No JVD present. Carotid bruit is not present. No thyromegaly present.  Cardiovascular: Normal rate, regular rhythm, normal heart sounds and intact distal pulses.  Exam reveals no gallop and no friction rub.   No murmur heard. Pulmonary/Chest: Effort normal. She has rales (bibasillar).  Abdominal: Soft. Bowel sounds are normal. She exhibits no distension and no mass. There is no tenderness.  Musculoskeletal: Normal range of motion.  Lymphadenopathy:    She has no cervical adenopathy.  Neurological: She is alert. She has normal reflexes. No cranial nerve deficit.  Skin: Skin is warm and dry.  Psychiatric: She has a normal mood and affect. Her behavior is normal. Judgment and thought content normal.   BP 104/62 mmHg  Pulse 78  Temp(Src) 97.7 F (36.5 C) (Oral)  Ht 5' (1.524 m)  Wt 138  lb (62.596 kg)  BMI 26.95 kg/m2          Assessment & Plan:  1. Essential hypertension, benign Do not add salt to diet - CMP14+EGFR - Lipid panel - valsartan (DIOVAN) 80 MG tablet; Take 1 tablet (80 mg total) by mouth daily.  Dispense: 90 tablet; Refill: 3  2. Hypothyroidism,  unspecified hypothyroidism type - levothyroxine (SYNTHROID, LEVOTHROID) 75 MCG tablet; Take 1 tablet (75 mcg total) by mouth daily.  Dispense: 90 tablet; Refill: 3  3. Osteopenia Continue weight bearing exercises  4. Dementia, without behavioral disturbance - donepezil (ARICEPT) 23 MG TABS tablet; Take 1 tablet (23 mg total) by mouth at bedtime.  Dispense: 30 tablet; Refill: 5  5. Essential hypertension  6. Urinary urgency - solifenacin (VESICARE) 10 MG tablet; Take 1 tablet (10 mg total) by mouth daily.  Dispense: 30 tablet; Refill: 5  7. Insomnia - mirtazapine (REMERON) 30 MG tablet; Take 1 tablet (30 mg total) by mouth at bedtime.  Dispense: 30 tablet; Refill: 5    Labs pending Health maintenance reviewed Diet and exercise encouraged Continue all meds Follow up  In 6 months   [place], FNP

## 2015-06-08 LAB — CMP14+EGFR
A/G RATIO: 1.2 (ref 1.1–2.5)
ALT: 14 IU/L (ref 0–32)
AST: 20 IU/L (ref 0–40)
Albumin: 3.9 g/dL (ref 3.5–4.7)
Alkaline Phosphatase: 102 IU/L (ref 39–117)
BILIRUBIN TOTAL: 0.5 mg/dL (ref 0.0–1.2)
BUN/Creatinine Ratio: 18 (ref 11–26)
BUN: 19 mg/dL (ref 8–27)
CHLORIDE: 97 mmol/L (ref 96–106)
CO2: 24 mmol/L (ref 18–29)
Calcium: 9 mg/dL (ref 8.7–10.3)
Creatinine, Ser: 1.08 mg/dL — ABNORMAL HIGH (ref 0.57–1.00)
GFR calc Af Amer: 54 mL/min/{1.73_m2} — ABNORMAL LOW (ref >59)
GFR calc non Af Amer: 47 mL/min/{1.73_m2} — ABNORMAL LOW (ref >59)
GLOBULIN, TOTAL: 3.3 g/dL (ref 1.5–4.5)
Glucose: 88 mg/dL (ref 65–99)
POTASSIUM: 5.2 mmol/L (ref 3.5–5.2)
SODIUM: 138 mmol/L (ref 134–144)
Total Protein: 7.2 g/dL (ref 6.0–8.5)

## 2015-06-08 LAB — LIPID PANEL
Chol/HDL Ratio: 2 ratio units (ref 0.0–4.4)
Cholesterol, Total: 167 mg/dL (ref 100–199)
HDL: 84 mg/dL (ref >39)
LDL Calculated: 68 mg/dL (ref 0–99)
TRIGLYCERIDES: 73 mg/dL (ref 0–149)
VLDL Cholesterol Cal: 15 mg/dL (ref 5–40)

## 2015-06-23 ENCOUNTER — Ambulatory Visit: Payer: Medicare Other | Admitting: Pharmacist

## 2015-07-14 ENCOUNTER — Encounter: Payer: Self-pay | Admitting: Pharmacist

## 2015-07-14 ENCOUNTER — Ambulatory Visit: Payer: Medicare Other

## 2015-07-14 ENCOUNTER — Ambulatory Visit (INDEPENDENT_AMBULATORY_CARE_PROVIDER_SITE_OTHER): Payer: Medicare Other | Admitting: Pharmacist

## 2015-07-14 VITALS — BP 130/80 | HR 72 | Ht 60.0 in | Wt 140.0 lb

## 2015-07-14 DIAGNOSIS — Z0000 Encounter for general adult medical examination without abnormal findings: Secondary | ICD-10-CM | POA: Diagnosis not present

## 2015-07-14 DIAGNOSIS — F0280 Dementia in other diseases classified elsewhere without behavioral disturbance: Secondary | ICD-10-CM | POA: Insufficient documentation

## 2015-07-14 DIAGNOSIS — G309 Alzheimer's disease, unspecified: Secondary | ICD-10-CM

## 2015-07-14 DIAGNOSIS — F0281 Dementia in other diseases classified elsewhere with behavioral disturbance: Secondary | ICD-10-CM

## 2015-07-14 DIAGNOSIS — Z780 Asymptomatic menopausal state: Secondary | ICD-10-CM

## 2015-07-14 DIAGNOSIS — M4000 Postural kyphosis, site unspecified: Secondary | ICD-10-CM

## 2015-07-14 DIAGNOSIS — G308 Other Alzheimer's disease: Secondary | ICD-10-CM

## 2015-07-14 NOTE — Progress Notes (Signed)
Patient ID: [name], female   DOB: May 16, 1928, 82 y.o.   MRN: [id number]   Subjective:   Ruth Case is a 82 y.o. white female who presents for an Initial Medicare Annual Wellness Visit. Ruth Case' daughter in law is present with her and helps with answering questions.  Ruth Case is widowed and has been living at [facility] for the last 5 months.  She reports that it has taken come getting use.   Review of Systems  Review of Systems  Constitutional: Negative.   HENT: Negative.   Eyes: Negative.   Respiratory: Negative.   Cardiovascular: Negative.   Gastrointestinal: Negative.   Genitourinary: Negative.   Musculoskeletal: Negative.   Skin: Negative.   Neurological: Negative.   Endo/Heme/Allergies: Negative.   Psychiatric/Behavioral: Positive for memory loss. The patient has insomnia (hasn't slept well in last 5 years or os but Remoron halps).      Current Medications (verified) Outpatient Encounter Prescriptions as of 07/14/2015  Medication Sig  . donepezil (ARICEPT) 23 MG TABS tablet Take 1 tablet (23 mg total) by mouth at bedtime.  . fluticasone (FLONASE) 50 MCG/ACT nasal spray Place 2 sprays into both nostrils daily.  Marland Kitchen levothyroxine (SYNTHROID, LEVOTHROID) 75 MCG tablet Take 1 tablet (75 mcg total) by mouth daily.  . mirtazapine (REMERON) 30 MG tablet Take 1 tablet (30 mg total) by mouth at bedtime.  . solifenacin (VESICARE) 10 MG tablet Take 1 tablet (10 mg total) by mouth daily.  . valsartan (DIOVAN) 80 MG tablet Take 1 tablet (80 mg total) by mouth daily.   No facility-administered encounter medications on file as of 07/14/2015.    Allergies (verified) Keflex and Penicillins   History: Past Medical History  Diagnosis Date  . Hypertension   . Thyroid disease   . GERD (gastroesophageal reflux disease)   . Confusion   . Glaucoma   . Intractable hiccups? 09/16/2013   History reviewed. No pertinent past surgical history. Family History    Problem Relation Age of Onset  . COPD Sister   . Stroke Brother   . Hypertension Brother    Social History   Occupational History  . retired    Social History Main Topics  . Smoking status: Never Smoker   . Smokeless tobacco: Never Used  . Alcohol Use: No  . Drug Use: No  . Sexual Activity: No    Do you feel safe at [facility]?  Yes  Dietary issues and exercise activities: Current Exercise Habits:: Home exercise routine, Type of exercise: walking (chair exercise class and dancing), Time (Minutes): 15, Frequency (Times/Week): 6, Weekly Exercise (Minutes/Week): 90, Intensity: Mild  Current Dietary habits:  Eats a varied diet that is provided by [employer]  Objective:    Today's Vitals   07/14/15 1229  BP: 130/80  Pulse: 72  Height: 5' (1.524 m)  Weight: 140 lb (63.504 kg)  PainSc: 0-No pain   Body mass index is 27.34 kg/(m^2).  Activities of Daily Living In your present state of health, do you have any difficulty performing the following activities: 07/14/2015  Hearing? N  Vision? N  Difficulty concentrating or making decisions? N  Walking or climbing stairs? N  Dressing or bathing? Y  Doing errands, shopping? Y  Preparing Food and eating ? Y  Using the Toilet? N  In the past six months, have you accidently leaked urine? N  Do you have problems with loss of bowel control? N  Managing your Medications? [name]  Managing your Finances? Y  Housekeeping or managing your Housekeeping? Y    Are there smokers in your home (other than you)? No   Cardiac Risk Factors include: advanced age (>50men, >58 women)  Depression Screen PHQ 2/9 Scores 07/14/2015 04/04/2015 03/11/2015 05/10/2014  PHQ - 2 Score 0 0 0 0    Fall Risk Fall Risk  07/14/2015 04/04/2015 03/11/2015 05/10/2014 02/01/2014  Falls in the past year? Yes No No No No  Number falls in past yr: 2 or more - - - -  Injury with Fall? No - - - -  Risk Factor Category  High Fall Risk - - - -  Follow up Falls  prevention discussed - - - -    Cognitive Function: MMSE - Mini Mental State Exam 07/14/2015 10/16/2012  Not completed: Unable to complete -  Orientation to time - 2  Orientation to Place - 4  Registration - 3  Attention/ Calculation - 0  Recall - 3  Language- name 2 objects - 2  Language- repeat - 0  Language- follow 3 step command - 3  Language- read & follow direction - 0  Write a sentence - 1  Copy design - 1  Total score - 19    Immunizations and Health Maintenance Immunization History  Administered Date(s) Administered  . Influenza,inj,Quad PF,36+ Mos 04/10/2013, 03/31/2014, 03/31/2015  . PPD Test 01/31/2015  . Pneumococcal Conjugate-13 03/31/2015   There are no preventive care reminders to display for this patient.  Patient Care Team: Bennie Pierini, FNP as PCP - General (Nurse Practitioner)  Indicate any recent Medical Services you may have received from other than [facility] providers in the past year (date may be approximate).    Assessment:    Annual Wellness Visit    Screening Tests Health Maintenance  Topic Date Due  . DEXA SCAN  12/05/2015 (Originally 01/26/30)  . ZOSTAVAX  12/05/2015 (Originally 01/06/1990)  . TETANUS/TDAP  12/05/2015 (Originally 08/20/2006)  . INFLUENZA VACCINE  12/20/2015  . PNA vac Low Risk Adult (2 of 2 - PPSV23) 03/30/2016        Plan:   During the course of the visit Ruth Case was educated and counseled about the following appropriate screening and preventive services:   Vaccines to include Pneumoccal, Influenza, Hepatitis B, Td, Zostavax - checked cost of Zostavax and Boostrix - which are both covered 100% by insurance.  Daughter in law will discuss with patient's sons and get at next visit  Colorectal cancer screening - colonoscopy no longer required  Diabetes screening - UTD  Bone Denisty / Osteoporosis Screening - checked today - awaiting reading by radiology  Mammogram -no longer required /  patient declined  PAP  - no longer required   Glaucoma screening / eye exam  Requested copy of Advanced Directives  Continue to be active daily  Discussed high risk medications - in particular Remeron.  Per daughter in law this medication has made a huge improvement in patinet's mood and increase in test.      Patient Instructions (the written plan) were given to the patient.   Henrene Pastor, [facility]   07/14/2015

## 2015-07-14 NOTE — Patient Instructions (Addendum)
  Ruth Case , Thank you for taking time to come for your Medicare Wellness Visit. I appreciate your ongoing commitment to your health goals. Please review the following plan we discussed and let me know if I can assist you in the future.   These are the goals we discussed: Continue to stay active - walk, dance and do chair exercise when able.    This is a list of the screening recommended for you and due dates:  Health Maintenance  Topic Date Due  . DEXA scan (bone density measurement)  Done today  . Shingles Vaccine  Due now - cost is covered by insurance  . Tetanus Vaccine  Due now - cost is covered by insurance  . Flu Shot  12/20/2015  . Pneumonia vaccines (2 of 2 - PPSV23) 03/30/2016  *Topic was postponed. The date shown is not the original due date.

## 2015-07-28 ENCOUNTER — Telehealth: Payer: Self-pay | Admitting: Nurse Practitioner

## 2015-08-01 ENCOUNTER — Ambulatory Visit (INDEPENDENT_AMBULATORY_CARE_PROVIDER_SITE_OTHER): Payer: Medicare Other | Admitting: Family

## 2015-08-01 ENCOUNTER — Encounter: Payer: Self-pay | Admitting: Family

## 2015-08-01 VITALS — BP 108/61 | HR 73 | Temp 96.9°F | Ht 60.0 in | Wt 142.0 lb

## 2015-08-01 DIAGNOSIS — M810 Age-related osteoporosis without current pathological fracture: Secondary | ICD-10-CM | POA: Diagnosis not present

## 2015-08-01 DIAGNOSIS — M5441 Lumbago with sciatica, right side: Secondary | ICD-10-CM

## 2015-08-01 DIAGNOSIS — M1712 Unilateral primary osteoarthritis, left knee: Secondary | ICD-10-CM | POA: Diagnosis not present

## 2015-08-01 MED ORDER — DICLOFENAC SODIUM 1 % TD GEL: 2.0000 g | Freq: Four times a day (QID) | TRANSDERMAL | Status: DC | PRN

## 2015-08-01 MED ORDER — CALCIUM CARBONATE-VITAMIN D3 600-400 MG-UNIT PO TABS: 2.0000 | ORAL_TABLET | Freq: Every day | ORAL | Status: AC

## 2015-08-01 MED ORDER — ALENDRONATE SODIUM 70 MG PO TABS: 70.0000 mg | ORAL_TABLET | ORAL | Status: DC

## 2015-08-01 MED ORDER — NAPROXEN 500 MG PO TABS: ORAL_TABLET | ORAL | Status: DC

## 2015-08-01 NOTE — Patient Instructions (Signed)
Sciatica Sciatica is pain, weakness, numbness, or tingling along the path of the sciatic nerve. The nerve starts in the lower back and runs down the back of each leg. The nerve controls the muscles in the lower leg and in the back of the knee, while also providing sensation to the back of the thigh, lower leg, and the sole of your foot. Sciatica is a symptom of another medical condition. For instance, nerve damage or certain conditions, such as a herniated disk or bone spur on the spine, pinch or put pressure on the sciatic nerve. This causes the pain, weakness, or other sensations normally associated with sciatica. Generally, sciatica only affects one side of the body. CAUSES   Herniated or slipped disc.  Degenerative disk disease.  A pain disorder involving the narrow muscle in the buttocks (piriformis syndrome).  Pelvic injury or fracture.  Pregnancy.  Tumor (rare). SYMPTOMS  Symptoms can vary from mild to very severe. The symptoms usually travel from the low back to the buttocks and down the back of the leg. Symptoms can include:  Mild tingling or dull aches in the lower back, leg, or hip.  Numbness in the back of the calf or sole of the foot.  Burning sensations in the lower back, leg, or hip.  Sharp pains in the lower back, leg, or hip.  Leg weakness.  Severe back pain inhibiting movement. These symptoms may get worse with coughing, sneezing, laughing, or prolonged sitting or standing. Also, being overweight may worsen symptoms. DIAGNOSIS  Your caregiver will perform a physical exam to look for common symptoms of sciatica. He or she may ask you to do certain movements or activities that would trigger sciatic nerve pain. Other tests may be performed to find the cause of the sciatica. These may include:  Blood tests.  X-rays.  Imaging tests, such as an MRI or CT scan. TREATMENT  Treatment is directed at the cause of the sciatic pain. Sometimes, treatment is not necessary  and the pain and discomfort goes away on its own. If treatment is needed, your caregiver may suggest:  Over-the-counter medicines to relieve pain.  Prescription medicines, such as anti-inflammatory medicine, muscle relaxants, or narcotics.  Applying heat or ice to the painful area.  Steroid injections to lessen pain, irritation, and inflammation around the nerve.  Reducing activity during periods of pain.  Exercising and stretching to strengthen your abdomen and improve flexibility of your spine. Your caregiver may suggest losing weight if the extra weight makes the back pain worse.  Physical therapy.  Surgery to eliminate what is pressing or pinching the nerve, such as a bone spur or part of a herniated disk. HOME CARE INSTRUCTIONS   Only take over-the-counter or prescription medicines for pain or discomfort as directed by your caregiver.  Apply ice to the affected area for 20 minutes, 3-4 times a day for the first 48-72 hours. Then try heat in the same way.  Exercise, stretch, or perform your usual activities if these do not aggravate your pain.  Attend physical therapy sessions as directed by your caregiver.  Keep all follow-up appointments as directed by your caregiver.  Do not wear high heels or shoes that do not provide proper support.  Check your mattress to see if it is too soft. A firm mattress may lessen your pain and discomfort. SEEK IMMEDIATE MEDICAL CARE IF:   You lose control of your bowel or bladder (incontinence).  You have increasing weakness in the lower back, pelvis, buttocks,   or legs.  You have redness or swelling of your back.  You have a burning sensation when you urinate.  You have pain that gets worse when you lie down or awakens you at night.  Your pain is worse than you have experienced in the past.  Your pain is lasting longer than 4 weeks.  You are suddenly losing weight without reason. MAKE SURE YOU:  Understand these  instructions.  Will watch your condition.  Will get help right away if you are not doing well or get worse.   This information is not intended to replace advice given to you by your health care provider. Make sure you discuss any questions you have with your health care provider.   Document Released: 05/01/2001 Document Revised: 01/26/2015 Document Reviewed: 09/16/2011 Elsevier Interactive Patient Education 2016 [employer] Osteoporosis Osteoporosis is the thinning and loss of density in the bones. Osteoporosis makes the bones more brittle, fragile, and likely to break (fracture). Over time, osteoporosis can cause the bones to become so weak that they fracture after a simple fall. The bones most likely to fracture are the bones in the hip, wrist, and spine. CAUSES  The exact cause is not known. RISK FACTORS Anyone can develop osteoporosis. You may be at greater risk if you have a family history of the condition or have poor nutrition. You may also have a higher risk if you are:   Female.   723 years old or older.  A smoker.  Not physically active.   White or Asian.  Slender. SIGNS AND SYMPTOMS  A fracture might be the first sign of the disease, especially if it results from a fall or injury that would not usually cause a bone to break. Other signs and symptoms include:   Low back and neck pain.  Stooped posture.  Height loss. DIAGNOSIS  To make a diagnosis, your health care provider may:  Take a medical history.  Perform a physical exam.  Order tests, such as:  A bone mineral density test.  A dual-energy X-ray absorptiometry test. TREATMENT  The goal of osteoporosis treatment is to strengthen your bones to reduce your risk of a fracture. Treatment may involve:  Making lifestyle changes, such as:  Eating a diet rich in calcium.  Doing weight-bearing and muscle-strengthening exercises.  Stopping tobacco use.  Limiting alcohol intake.  Taking medicine to  slow the process of bone loss or to increase bone density.  Monitoring your levels of calcium and vitamin D. HOME CARE INSTRUCTIONS  Include calcium and vitamin D in your diet. Calcium is important for bone health, and vitamin D helps the body absorb calcium.  Perform weight-bearing and muscle-strengthening exercises as directed by your health care provider.  Do not use any tobacco products, including cigarettes, chewing tobacco, and electronic cigarettes. If you need help quitting, ask your health care provider.  Limit your alcohol intake.  Take medicines only as directed by your health care provider.  Keep all follow-up visits as directed by your health care provider. This is important.  Take precautions at home to lower your risk of falling, such as:  Keeping rooms well lit and clutter free.  Installing safety rails on stairs.  Using rubber mats in the bathroom and other areas that are often wet or slippery. SEEK IMMEDIATE MEDICAL CARE IF:  You fall or injure yourself.    This information is not intended to replace advice given to you by your health care provider. Make sure you discuss any  questions you have with your health care provider.   Document Released: 02/14/2005 Document Revised: 05/28/2014 Document Reviewed: 10/15/2013 Elsevier Interactive Patient Education [employer].

## 2015-08-01 NOTE — Telephone Encounter (Signed)
i cannot see results of dexa scan- could you possibly pull results and call patient

## 2015-08-01 NOTE — Progress Notes (Signed)
Subjective:    Patient ID: Ruth Case, female    DOB: 05-24-1927, 82 y.o.   MRN: [id number]  Back Pain This is a new problem. The current episode started 1 to 4 weeks ago. The problem occurs constantly. The problem is unchanged. The pain is present in the lumbar spine. The quality of the pain is described as aching. The pain radiates to the right thigh. The pain is at a severity of 10/10. The pain is moderate. The symptoms are aggravated by bending and sitting. Associated symptoms include leg pain. Pertinent negatives include no bladder incontinence, bowel incontinence or headaches. Risk factors include obesity. She has tried bed rest for the symptoms. The treatment provided mild relief.   Daughter would also like to go over pt's bone density.    Review of Systems  Constitutional: Negative.   HENT: Negative.   Eyes: Negative.   Respiratory: Negative.  Negative for shortness of breath.   Cardiovascular: Negative.  Negative for palpitations.  Gastrointestinal: Negative.  Negative for bowel incontinence.  Endocrine: Negative.   Genitourinary: Negative.  Negative for bladder incontinence.  Musculoskeletal: Positive for back pain.  Neurological: Negative.  Negative for headaches.  Hematological: Negative.   Psychiatric/Behavioral: Negative.   All other systems reviewed and are negative.      Objective:   Physical Exam  Constitutional: She is oriented to person, place, and time. She appears well-developed and well-nourished. No distress.  HENT:  Head: Normocephalic and atraumatic.  Eyes: Pupils are equal, round, and reactive to light.  Neck: Normal range of motion. Neck supple. No thyromegaly present.  Cardiovascular: Normal rate, regular rhythm, normal heart sounds and intact distal pulses.   No murmur heard. Pulmonary/Chest: Effort normal and breath sounds normal. No respiratory distress. She has no wheezes.  Abdominal: Soft. Bowel sounds are normal. She exhibits no  distension. There is no tenderness.  Musculoskeletal: Normal range of motion. She exhibits edema (2+ in left knee) and tenderness.  Neurological: She is alert and oriented to person, place, and time. No cranial nerve deficit.  Skin: Skin is warm and dry.  Psychiatric: She has a normal mood and affect. Her behavior is normal. Judgment and thought content normal.  dementia   Vitals reviewed.     BP 108/61 mmHg  Pulse 73  Temp(Src) 96.9 F (36.1 C) (Oral)  Ht 5' (1.524 m)  Wt 142 lb (64.411 kg)  BMI 27.73 kg/m2     Assessment & Plan:  1. Osteoporosis -Weight bearing exercises discussed  -PT started on Fosamax, calcium, and vit d today - alendronate (FOSAMAX) 70 MG tablet; Take 1 tablet (70 mg total) by mouth every 7 (seven) days. Take with a full glass of water on an empty stomach.  Dispense: 4 tablet; Refill: 11 - Calcium Carbonate-Vitamin D3 (CALCIUM 600/VITAMIN D) 600-400 MG-UNIT TABS; Take 2 tablets by mouth daily.  Dispense: 60 tablet; Refill: 6  2. Primary osteoarthritis of left knee -ROM exercises discussed  - naproxen (NAPROSYN) 500 MG tablet; Take BID for 1 week, then as BID prn for pain  Dispense: 60 tablet; Refill: 1 - diclofenac sodium (VOLTAREN) 1 % GEL; Apply 2 g topically 4 (four) times daily as needed. To bilateral knees for pain as needed  Dispense: 100 g; Refill: 6  3. Right-sided low back pain with right-sided sciatica -Rest -Ice and heat as needed -ROM and stretches discussed - naproxen (NAPROSYN) 500 MG tablet; Take BID for 1 week, then as BID prn for pain  Dispense: 60  tablet; Refill: 1 - diclofenac sodium (VOLTAREN) 1 % GEL; Apply 2 g topically 4 (four) times daily as needed. To bilateral knees for pain as needed  Dispense: 100 g; Refill: 6  Jannifer Rodneyhristy Dominque Marlin, FNP

## 2015-08-04 ENCOUNTER — Telehealth: Payer: Self-pay

## 2015-08-04 NOTE — Telephone Encounter (Signed)
Insurance prior authorized Diclofenac through 05/20/16

## 2015-10-28 ENCOUNTER — Encounter (HOSPITAL_COMMUNITY): Payer: Self-pay | Admitting: Cardiology

## 2015-10-28 ENCOUNTER — Emergency Department (HOSPITAL_COMMUNITY)
Admission: EM | Admit: 2015-10-28 | Discharge: 2015-10-28 | Disposition: A | Discharge status: Home / Self Care | Payer: Medicare Other | Attending: Emergency Medicine | Admitting: Emergency Medicine

## 2015-10-28 ENCOUNTER — Telehealth: Payer: Self-pay | Admitting: Nurse Practitioner

## 2015-10-28 ENCOUNTER — Emergency Department (HOSPITAL_COMMUNITY): Payer: Medicare Other

## 2015-10-28 DIAGNOSIS — S72009A Fracture of unspecified part of neck of unspecified femur, initial encounter for closed fracture: Secondary | ICD-10-CM

## 2015-10-28 DIAGNOSIS — M1990 Unspecified osteoarthritis, unspecified site: Secondary | ICD-10-CM | POA: Diagnosis not present

## 2015-10-28 DIAGNOSIS — Y999 Unspecified external cause status: Secondary | ICD-10-CM | POA: Insufficient documentation

## 2015-10-28 DIAGNOSIS — M7989 Other specified soft tissue disorders: Secondary | ICD-10-CM

## 2015-10-28 DIAGNOSIS — S72002A Fracture of unspecified part of neck of left femur, initial encounter for closed fracture: Secondary | ICD-10-CM | POA: Diagnosis not present

## 2015-10-28 DIAGNOSIS — Y939 Activity, unspecified: Secondary | ICD-10-CM | POA: Diagnosis not present

## 2015-10-28 DIAGNOSIS — I10 Essential (primary) hypertension: Secondary | ICD-10-CM | POA: Diagnosis not present

## 2015-10-28 DIAGNOSIS — Y929 Unspecified place or not applicable: Secondary | ICD-10-CM | POA: Insufficient documentation

## 2015-10-28 DIAGNOSIS — M79662 Pain in left lower leg: Secondary | ICD-10-CM

## 2015-10-28 DIAGNOSIS — M25552 Pain in left hip: Secondary | ICD-10-CM | POA: Diagnosis present

## 2015-10-28 DIAGNOSIS — X58XXXA Exposure to other specified factors, initial encounter: Secondary | ICD-10-CM | POA: Diagnosis not present

## 2015-10-28 HISTORY — DX: Unspecified osteoarthritis, unspecified site: M19.90

## 2015-10-28 LAB — CBC
HCT: 38.3 % (ref 36.0–46.0)
HEMOGLOBIN: 12.5 g/dL (ref 12.0–15.0)
MCH: 27.8 pg (ref 26.0–34.0)
MCHC: 32.6 g/dL (ref 30.0–36.0)
MCV: 85.3 fL (ref 78.0–100.0)
PLATELETS: 235 10*3/uL (ref 150–400)
RBC: 4.49 MIL/uL (ref 3.87–5.11)
RDW: 14.9 % (ref 11.5–15.5)
WBC: 9.8 10*3/uL (ref 4.0–10.5)

## 2015-10-28 LAB — BASIC METABOLIC PANEL
ANION GAP: 7 (ref 5–15)
BUN: 34 mg/dL — AB (ref 6–20)
CHLORIDE: 99 mmol/L — AB (ref 101–111)
CO2: 27 mmol/L (ref 22–32)
Calcium: 9.4 mg/dL (ref 8.9–10.3)
Creatinine, Ser: 1.48 mg/dL — ABNORMAL HIGH (ref 0.44–1.00)
GFR, EST AFRICAN AMERICAN: 36 mL/min — AB (ref >60)
GFR, EST NON AFRICAN AMERICAN: 31 mL/min — AB (ref >60)
Glucose, Bld: 94 mg/dL (ref 65–99)
POTASSIUM: 4.9 mmol/L (ref 3.5–5.1)
SODIUM: 133 mmol/L — AB (ref 135–145)

## 2015-10-28 LAB — D-DIMER, QUANTITATIVE (NOT AT ARMC): D DIMER QUANT: 0.88 ug{FEU}/mL — AB (ref 0.00–0.50)

## 2015-10-28 NOTE — ED Provider Notes (Signed)
CSN: [id number]     Arrival date & time 10/28/15  1727 History   First MD Initiated Contact with Patient 10/28/15 1740     Chief Complaint  Patient presents with  . Hip Pain     (Consider location/radiation/quality/duration/timing/severity/associated sxs/prior Treatment) HPI Comments: The patient is an 82 year old female, she has dementia, she is unable to participate with a history component of this exam. She has no history of blood clots but over the last 24 hours the patient has had some difficulty with ambulating because of pain in the left thigh and some swelling which was noted by family. There was no known injuries but the patient does not give any history about a fall. The symptoms are persistent, nothing seems to make it better, worse with ambulation, not associated with fevers coughing or difficulty breathing  Patient is a 82 y.o. female presenting with hip pain. The history is provided by the patient.  Hip Pain    Past Medical History  Diagnosis Date  . Hypertension   . Thyroid disease   . GERD (gastroesophageal reflux disease)   . Confusion   . Glaucoma   . Intractable hiccups? 09/16/2013  . Arthritis    History reviewed. No pertinent past surgical history. Family History  Problem Relation Age of Onset  . COPD Sister   . Stroke Brother   . Hypertension Brother    Social History  Substance Use Topics  . Smoking status: Never Smoker   . Smokeless tobacco: Never Used  . Alcohol Use: No   OB History    No data available     Review of Systems  Unable to perform ROS: Dementia      Allergies  Keflex and Penicillins  Home Medications   Prior to Admission medications   Medication Sig Start Date End Date Taking? Authorizing Provider  alendronate (FOSAMAX) 70 MG tablet Take 1 tablet (70 mg total) by mouth every 7 (seven) days. Take with a full glass of water on an empty stomach. 08/01/15   Junie Spencer, FNP  Calcium Carbonate-Vitamin D3 (CALCIUM 600/VITAMIN  D) 600-400 MG-UNIT TABS Take 2 tablets by mouth daily. 08/01/15   Junie Spencer, FNP  diclofenac sodium (VOLTAREN) 1 % GEL Apply 2 g topically 4 (four) times daily as needed. To bilateral knees for pain as needed 08/01/15   Junie Spencer, FNP  donepezil (ARICEPT) 23 MG TABS tablet Take 1 tablet (23 mg total) by mouth at bedtime. 06/07/15   Mary-Margaret Daphine Deutscher, FNP  fluticasone (FLONASE) 50 MCG/ACT nasal spray Place 2 sprays into both nostrils daily. 06/07/15   Mary-Margaret Daphine Deutscher, FNP  levothyroxine (SYNTHROID, LEVOTHROID) 75 MCG tablet Take 1 tablet (75 mcg total) by mouth daily. 06/07/15   Mary-Margaret Daphine Deutscher, FNP  mirtazapine (REMERON) 30 MG tablet Take 1 tablet (30 mg total) by mouth at bedtime. 06/07/15   Mary-Margaret Daphine Deutscher, FNP  naproxen (NAPROSYN) 500 MG tablet Take BID for 1 week, then as BID prn for pain 08/01/15   Junie Spencer, FNP  solifenacin (VESICARE) 10 MG tablet Take 1 tablet (10 mg total) by mouth daily. 06/07/15   Mary-Margaret Daphine Deutscher, FNP  valsartan (DIOVAN) 80 MG tablet Take 1 tablet (80 mg total) by mouth daily. 06/07/15   Mary-Margaret Daphine Deutscher, FNP   BP 135/72 mmHg  Pulse 61  Temp(Src) 99.1 F (37.3 C) (Oral)  Resp 16  Ht  (1.549 m)  Wt 140 lb (63.504 kg)  BMI 26.47 kg/m2  SpO2 94% Physical Exam  Constitutional: She appears well-developed and well-nourished. No distress.  HENT:  Head: Normocephalic and atraumatic.  Mouth/Throat: Oropharynx is clear and moist. No oropharyngeal exudate.  Eyes: Conjunctivae and EOM are normal. Pupils are equal, round, and reactive to light. Right eye exhibits no discharge. Left eye exhibits no discharge. No scleral icterus.  Neck: Normal range of motion. Neck supple. No JVD present. No thyromegaly present.  Cardiovascular: Normal rate, regular rhythm, normal heart sounds and intact distal pulses.  Exam reveals no gallop and no friction rub.   No murmur heard. Pulmonary/Chest: Effort normal and breath sounds normal. No respiratory  distress. She has no wheezes. She has no rales.  Abdominal: Soft. Bowel sounds are normal. She exhibits no distension and no mass. There is no tenderness.  Musculoskeletal: Normal range of motion. She exhibits tenderness ( Swelling and tenderness about the left thigh, normal range of motion of the hip and the knee without obvious pain). She exhibits no edema.  No leg length discrepancy  Lymphadenopathy:    She has no cervical adenopathy.  Neurological: She is alert. Coordination normal.  Skin: Skin is warm and dry. No rash noted. No erythema.  Psychiatric: She has a normal mood and affect. Her behavior is normal.  Nursing note and vitals reviewed.   ED Course  Procedures (including critical care time) Labs Review Labs Reviewed  BASIC METABOLIC PANEL - Abnormal; Notable for the following:    Sodium 133 (*)    Chloride 99 (*)    BUN 34 (*)    Creatinine, Ser 1.48 (*)    GFR calc non Af Amer 31 (*)    GFR calc Af Amer 36 (*)    All other components within normal limits  D-DIMER, QUANTITATIVE (NOT AT [facility]) - Abnormal; Notable for the following:    D-Dimer, Quant 0.88 (*)    All other components within normal limits  CBC    Imaging Review Dg Knee Complete 4 Views Left  10/28/2015  CLINICAL DATA:  82 year old female with left knee pain. EXAM: LEFT KNEE - COMPLETE 4+ VIEW COMPARISON:  None. FINDINGS: There is no acute fracture or dislocation. The bones are osteopenic. There is osteoarthritic changes of the mean with tricompartmental narrowing, chondrocalcinosis, and marginal spurring. No joint effusion. The soft tissues are grossly unremarkable. IMPRESSION: No acute fracture or dislocation. Osteoarthritis. Electronically Signed   By: Elgie Collard M.D.   On: 10/28/2015 18:56   Dg Hip Unilat With Pelvis 2-3 Views Left  10/28/2015  CLINICAL DATA:  Left hip pain, no known injury EXAM: DG HIP (WITH OR WITHOUT PELVIS) 2-3V LEFT COMPARISON:  None. FINDINGS: Three views of the left hip  submitted. No acute fracture or subluxation. There is diffuse osteopenia. Mild degenerative changes pubic symphysis. Bilateral hip joints are symmetrical in appearance. Minimal superior acetabular spurring bilaterally. IMPRESSION: No acute fracture or subluxation. Mild degenerative changes. Diffuse osteopenia. Electronically Signed   By: Natasha Mead M.D.   On: 10/28/2015 18:55   I have personally reviewed and evaluated these images and lab results as part of my medical decision-making.    MDM   Final diagnoses:  Hip fracture (HCC)  Leg swelling    The patient has normal vital signs, she has swelling of the left thigh, no history of DVT, ultrasound is not available at this hour, we'll obtain d-dimer, labs, x-ray of the hip and the knee, ultrasound in the morning as needed   The pt and familiy were informed of the lab tests including teh  positive D dimer.   US scheduled for the morning No frx seen on xray All in agreement not to give anticoagulation tonight.  Eber HongBrian Jazzmine Kleiman, MD 10/28/15 2030

## 2015-10-28 NOTE — ED Notes (Signed)
Left leg swelling and left hip pain today.   Pt crying when trying to ambulate.  Denies any injury

## 2015-10-28 NOTE — Telephone Encounter (Signed)
Patients daughter advised that patient should be evaluated by the ER per Dr. Ermalinda MemosBradshaw he is concerned that she may have a DVT or cellulitis. Patients daughter will have patient evaluated by ER.

## 2015-10-28 NOTE — ED Notes (Signed)
Phlebotomy at bedside.

## 2015-10-28 NOTE — Discharge Instructions (Signed)
Please return in the morning for an Ultrasound of the leg - you do not need to check into the ER for this - it is an outpatient study.  Please obtain all of your results from medical records or have your doctors office obtain the results - share them with your doctor - you should be seen at your doctors office in the next 2 days. Call today to arrange your follow up. Take the medications as prescribed. Please review all of the medicines and only take them if you do not have an allergy to them. Please be aware that if you are taking birth control pills, taking other prescriptions, ESPECIALLY ANTIBIOTICS may make the birth control ineffective - if this is the case, either do not engage in sexual activity or use alternative methods of birth control such as condoms until you have finished the medicine and your family doctor says it is OK to restart them. If you are on a blood thinner such as COUMADIN, be aware that any other medicine that you take may cause the coumadin to either work too much, or not enough - you should have your coumadin level rechecked in next 7 days if this is the case.  ?  It is also a possibility that you have an allergic reaction to any of the medicines that you have been prescribed - Everybody reacts differently to medications and while MOST people have no trouble with most medicines, you may have a reaction such as nausea, vomiting, rash, swelling, shortness of breath. If this is the case, please stop taking the medicine immediately and contact your physician.  ?  You should return to the ER if you develop severe or worsening symptoms.

## 2015-10-29 ENCOUNTER — Ambulatory Visit (HOSPITAL_COMMUNITY)
Admission: RE | Admit: 2015-10-29 | Discharge: 2015-10-29 | Disposition: A | Discharge status: Home / Self Care | Payer: Medicare Other | Source: Ambulatory Visit | Attending: Emergency Medicine | Admitting: Emergency Medicine

## 2015-10-29 DIAGNOSIS — M7122 Synovial cyst of popliteal space [Baker], left knee: Secondary | ICD-10-CM | POA: Insufficient documentation

## 2015-10-29 DIAGNOSIS — M79662 Pain in left lower leg: Secondary | ICD-10-CM

## 2015-10-29 DIAGNOSIS — M7989 Other specified soft tissue disorders: Secondary | ICD-10-CM | POA: Diagnosis present

## 2015-10-29 DIAGNOSIS — M79605 Pain in left leg: Secondary | ICD-10-CM | POA: Insufficient documentation

## 2015-10-29 NOTE — ED Provider Notes (Signed)
Pt returned for ultrasound   No dvt.   Popliteal cyst seen.   Pt has an order for tylenol and naprosyn for pain.   Lonia SkinnerLeslie K Home GardenSofia, PA-C 10/29/15 [id number]  Eber HongBrian Miller, MD 10/30/15 [phone]

## 2015-11-17 ENCOUNTER — Telehealth: Payer: Self-pay | Admitting: Nurse Practitioner

## 2015-11-17 NOTE — Telephone Encounter (Signed)
[facility] at [facility] BP this am and it was 82/52. She just rechecked it at 10:20 and it was 122/80. I advised them to just continue to monitor her BP and if dropped again to give us a call back.

## 2015-11-17 NOTE — Telephone Encounter (Signed)
[facility] at [facility] is aware.

## 2015-11-17 NOTE — Telephone Encounter (Signed)
Hold diovan and let me know if blood pressure starts going back up.

## 2015-11-23 ENCOUNTER — Telehealth: Payer: Self-pay | Admitting: Family Medicine

## 2015-11-23 ENCOUNTER — Ambulatory Visit: Payer: Medicare Other | Admitting: Nurse Practitioner

## 2015-11-23 NOTE — Telephone Encounter (Signed)
Wanted apt to be seen today, Apt made 7/5 at 4pm with MMM.

## 2015-12-02 ENCOUNTER — Encounter: Payer: Self-pay | Admitting: Nurse Practitioner

## 2015-12-06 ENCOUNTER — Encounter: Payer: Self-pay | Admitting: Family

## 2015-12-06 ENCOUNTER — Ambulatory Visit (INDEPENDENT_AMBULATORY_CARE_PROVIDER_SITE_OTHER): Payer: Medicare Other | Admitting: Nurse Practitioner

## 2015-12-06 ENCOUNTER — Encounter: Payer: Self-pay | Admitting: Nurse Practitioner

## 2015-12-06 VITALS — BP 133/69 | HR 69 | Temp 97.1°F | Ht 61.0 in | Wt 134.0 lb

## 2015-12-06 DIAGNOSIS — F02818 Dementia in other diseases classified elsewhere, unspecified severity, with other behavioral disturbance: Secondary | ICD-10-CM

## 2015-12-06 DIAGNOSIS — F0281 Dementia in other diseases classified elsewhere with behavioral disturbance: Secondary | ICD-10-CM

## 2015-12-06 DIAGNOSIS — R3915 Urgency of urination: Secondary | ICD-10-CM

## 2015-12-06 DIAGNOSIS — G4700 Insomnia, unspecified: Secondary | ICD-10-CM | POA: Diagnosis not present

## 2015-12-06 DIAGNOSIS — F0390 Unspecified dementia without behavioral disturbance: Secondary | ICD-10-CM | POA: Diagnosis not present

## 2015-12-06 DIAGNOSIS — I10 Essential (primary) hypertension: Secondary | ICD-10-CM | POA: Diagnosis not present

## 2015-12-06 DIAGNOSIS — E039 Hypothyroidism, unspecified: Secondary | ICD-10-CM | POA: Diagnosis not present

## 2015-12-06 DIAGNOSIS — G308 Other Alzheimer's disease: Secondary | ICD-10-CM

## 2015-12-06 MED ORDER — DONEPEZIL HCL 23 MG PO TABS: 23.0000 mg | ORAL_TABLET | Freq: Every day | ORAL | Status: DC

## 2015-12-06 MED ORDER — DONEPEZIL HCL 23 MG PO TABS: 23.0000 mg | ORAL_TABLET | Freq: Every day | ORAL | Status: AC

## 2015-12-06 MED ORDER — SOLIFENACIN SUCCINATE 10 MG PO TABS: 10.0000 mg | ORAL_TABLET | Freq: Every day | ORAL | Status: DC

## 2015-12-06 MED ORDER — MIRTAZAPINE 30 MG PO TABS: 30.0000 mg | ORAL_TABLET | Freq: Every day | ORAL | Status: AC

## 2015-12-06 MED ORDER — MIRTAZAPINE 30 MG PO TABS: 30.0000 mg | ORAL_TABLET | Freq: Every day | ORAL | Status: DC

## 2015-12-06 MED ORDER — LEVOTHYROXINE SODIUM 75 MCG PO TABS: 75.0000 ug | ORAL_TABLET | Freq: Every day | ORAL | Status: DC

## 2015-12-06 MED ORDER — SOLIFENACIN SUCCINATE 10 MG PO TABS: 10.0000 mg | ORAL_TABLET | Freq: Every day | ORAL | Status: AC

## 2015-12-06 NOTE — Progress Notes (Signed)
Subjective:    Patient ID: Ruth Case, female    DOB: 07/17/1927, 82 y.o.   MRN: [id number]   Patient here today for follow up of chronic medical problems.Her daughter in law is with her today. SHe lives a [place] care facility and has adjusted well.  Outpatient Encounter Prescriptions as of 12/06/2015  Medication Sig  . alendronate (FOSAMAX) 70 MG tablet Take 1 tablet (70 mg total) by mouth every 7 (seven) days. Take with a full glass of water on an empty stomach.  . Calcium Carbonate-Vitamin D3 (CALCIUM 600/VITAMIN D) 600-400 MG-UNIT TABS Take 2 tablets by mouth daily.  . diclofenac sodium (VOLTAREN) 1 % GEL Apply 2 g topically 4 (four) times daily as needed. To bilateral knees for pain as needed  . donepezil (ARICEPT) 23 MG TABS tablet Take 1 tablet (23 mg total) by mouth at bedtime.  . fluticasone (FLONASE) 50 MCG/ACT nasal spray Place 2 sprays into both nostrils daily.  Marland Kitchen levothyroxine (SYNTHROID, LEVOTHROID) 75 MCG tablet Take 1 tablet (75 mcg total) by mouth daily.  . mirtazapine (REMERON) 30 MG tablet Take 1 tablet (30 mg total) by mouth at bedtime.  . naproxen (NAPROSYN) 500 MG tablet Take BID for 1 week, then as BID prn for pain  . solifenacin (VESICARE) 10 MG tablet Take 1 tablet (10 mg total) by mouth daily.  . valsartan (DIOVAN) 80 MG tablet Take 1 tablet (80 mg total) by mouth daily.   No facility-administered encounter medications on file as of 12/06/2015.    Hypertension This is a chronic problem. The current episode started more than 1 year ago. The problem is unchanged. The problem is controlled. Pertinent negatives include no chest pain, headaches, malaise/fatigue or palpitations. Risk factors for coronary artery disease include dyslipidemia, post-menopausal state and sedentary lifestyle. Treatments tried: blood pressure was getting low at care facility so currently holding  ARB- they will contact us if blood presure statrts going up. The current treatment provides  mild improvement. Hypertensive end-organ damage includes a thyroid problem. There is no history of CAD/MI or CVA.  Thyroid Problem Presents for follow-up (hypothyroidism) visit. Patient reports no palpitations. The symptoms have been stable.  urinary urgency vesicare '10mg'$  daily helps but still has occasional urgency. Has had some dysuira recently and her gait has slowed- last time she was moving  This slow she had a UTI. Memory disturbance/dementia aricept daily- family says that she continues to worsen- recognizes some family memebers but not all. Gets confused very easily. Chronic hiccups They have tried everything and nothing helps.    Review of Systems  Constitutional: Negative.  Negative for malaise/fatigue.  HENT: Negative.   Respiratory: Negative.   Cardiovascular: Negative.  Negative for chest pain and palpitations.  Genitourinary: Negative.   Neurological: Negative.  Negative for headaches.  Psychiatric/Behavioral: Negative.   All other systems reviewed and are negative.      Objective:   Physical Exam  Constitutional: She appears well-developed and well-nourished.  HENT:  Nose: Nose normal.  Mouth/Throat: Oropharynx is clear and moist.  Eyes: EOM are normal.  Neck: Trachea normal, normal range of motion and full passive range of motion without pain. Neck supple. No JVD present. Carotid bruit is not present. No thyromegaly present.  Cardiovascular: Normal rate, regular rhythm, normal heart sounds and intact distal pulses.  Exam reveals no gallop and no friction rub.   No murmur heard. Pulmonary/Chest: Effort normal. She has rales (bibasillar).  Abdominal: Soft. Bowel sounds are normal. She  exhibits no distension and no mass. There is no tenderness.  Musculoskeletal: Normal range of motion.  bil knee edema  Lymphadenopathy:    She has no cervical adenopathy.  Neurological: She is alert. She has normal reflexes. No cranial nerve deficit.  Skin: Skin is warm and dry.    Psychiatric: She has a normal mood and affect. Her behavior is normal. Judgment and thought content normal.   BP 133/69 mmHg  Pulse 69  Temp(Src) 97.1 F (36.2 C) (Oral)  Ht '5\' 1"'$  (1.549 m)  Wt 134 lb (60.782 kg)  BMI 25.33 kg/m2       Assessment & Plan:  1. Essential hypertension, benign Do not add salt to diet - CMP14+EGFR - Lipid panel  2. Hypothyroidism, unspecified hypothyroidism type - levothyroxine (SYNTHROID, LEVOTHROID) 75 MCG tablet; Take 1 tablet (75 mcg total) by mouth daily.  Dispense: 90 tablet; Refill: 3 - Thyroid Panel With TSH  3. Alzheimer's disease of other onset with behavioral disturbance  4. Dementia, without behavioral disturbance - donepezil (ARICEPT) 23 MG TABS tablet; Take 1 tablet (23 mg total) by mouth at bedtime.  Dispense: 30 tablet; Refill: 5  5. Insomnia Bedtime routine is important - mirtazapine (REMERON) 30 MG tablet; Take 1 tablet (30 mg total) by mouth at bedtime.  Dispense: 30 tablet; Refill: 5  6. Urinary urgency - solifenacin (VESICARE) 10 MG tablet; Take 1 tablet (10 mg total) by mouth daily.  Dispense: 30 tablet; Refill: 5    Labs pending Health maintenance reviewed Diet and exercise encouraged Continue all meds Follow up  In 6 months   [place], FNP

## 2015-12-06 NOTE — Patient Instructions (Signed)
Fall Prevention in the Home   Falls can cause injuries. They can happen to people of all ages. There are many things you can do to make your home safe and to help prevent falls.   WHAT CAN I DO ON THE OUTSIDE OF MY HOME?  · Regularly fix the edges of walkways and driveways and fix any cracks.  · Remove anything that might make you trip as you walk through a door, such as a raised step or threshold.  · Trim any bushes or trees on the path to your home.  · Use bright outdoor lighting.  · Clear any walking paths of anything that might make someone trip, such as rocks or tools.  · Regularly check to see if handrails are loose or broken. Make sure that both sides of any steps have handrails.  · Any raised decks and porches should have guardrails on the edges.  · Have any leaves, snow, or ice cleared regularly.  · Use sand or salt on walking paths during winter.  · Clean up any spills in your garage right away. This includes oil or grease spills.  WHAT CAN I DO IN THE BATHROOM?   · Use night lights.  · Install grab bars by the toilet and in the tub and shower. Do not use towel bars as grab bars.  · Use non-skid mats or decals in the tub or shower.  · If you need to sit down in the shower, use a plastic, non-slip stool.  · Keep the floor dry. Clean up any water that spills on the floor as soon as it happens.  · Remove soap buildup in the tub or shower regularly.  · Attach bath mats securely with double-sided non-slip rug tape.  · Do not have throw rugs and other things on the floor that can make you trip.  WHAT CAN I DO IN THE BEDROOM?  · Use night lights.  · Make sure that you have a light by your bed that is easy to reach.  · Do not use any sheets or blankets that are too big for your bed. They should not hang down onto the floor.  · Have a firm chair that has side arms. You can use this for support while you get dressed.  · Do not have throw rugs and other things on the floor that can make you trip.  WHAT CAN I DO IN  THE KITCHEN?  · Clean up any spills right away.  · Avoid walking on wet floors.  · Keep items that you use a lot in easy-to-reach places.  · If you need to reach something above you, use a strong step stool that has a grab bar.  · Keep electrical cords out of the way.  · Do not use floor polish or wax that makes floors slippery. If you must use wax, use non-skid floor wax.  · Do not have throw rugs and other things on the floor that can make you trip.  WHAT CAN I DO WITH MY STAIRS?  · Do not leave any items on the stairs.  · Make sure that there are handrails on both sides of the stairs and use them. Fix handrails that are broken or loose. Make sure that handrails are as long as the stairways.  · Check any carpeting to make sure that it is firmly attached to the stairs. Fix any carpet that is loose or worn.  · Avoid having throw rugs at the top   or bottom of the stairs. If you do have throw rugs, attach them to the floor with carpet tape.  · Make sure that you have a light switch at the top of the stairs and the bottom of the stairs. If you do not have them, ask someone to add them for you.  WHAT ELSE CAN I DO TO HELP PREVENT FALLS?  · Wear shoes that:    Do not have high heels.    Have rubber bottoms.    Are comfortable and fit you well.    Are closed at the toe. Do not wear sandals.  · If you use a stepladder:    Make sure that it is fully opened. Do not climb a closed stepladder.    Make sure that both sides of the stepladder are locked into place.    Ask someone to hold it for you, if possible.  · Clearly mark and make sure that you can see:    Any grab bars or handrails.    First and last steps.    Where the edge of each step is.  · Use tools that help you move around (mobility aids) if they are needed. These include:    Canes.    Walkers.    Scooters.    Crutches.  · Turn on the lights when you go into a dark area. Replace any light bulbs as soon as they burn out.  · Set up your furniture so you have a clear  path. Avoid moving your furniture around.  · If any of your floors are uneven, fix them.  · If there are any pets around you, be aware of where they are.  · Review your medicines with your doctor. Some medicines can make you feel dizzy. This can increase your chance of falling.  Ask your doctor what other things that you can do to help prevent falls.     This information is not intended to replace advice given to you by your health care provider. Make sure you discuss any questions you have with your health care provider.     Document Released: 03/03/2009 Document Revised: 09/21/2014 Document Reviewed: 06/11/2014  Elsevier Interactive Patient Education ©2016 [employer]

## 2015-12-06 NOTE — Addendum Note (Signed)
Addended by: Bennie PieriniMARTIN, MARY-MARGARET on: 12/06/2015 10:47 AM   Modules accepted: Orders

## 2016-01-19 ENCOUNTER — Encounter (HOSPITAL_COMMUNITY): Payer: Self-pay

## 2016-01-19 ENCOUNTER — Emergency Department (HOSPITAL_COMMUNITY)
Admission: EM | Admit: 2016-01-19 | Discharge: 2016-01-20 | Disposition: A | Discharge status: Home / Self Care | Payer: Medicare Other | Attending: Emergency Medicine | Admitting: Emergency Medicine

## 2016-01-19 DIAGNOSIS — E039 Hypothyroidism, unspecified: Secondary | ICD-10-CM | POA: Diagnosis not present

## 2016-01-19 DIAGNOSIS — Z79899 Other long term (current) drug therapy: Secondary | ICD-10-CM | POA: Insufficient documentation

## 2016-01-19 DIAGNOSIS — I10 Essential (primary) hypertension: Secondary | ICD-10-CM | POA: Insufficient documentation

## 2016-01-19 DIAGNOSIS — Y939 Activity, unspecified: Secondary | ICD-10-CM | POA: Insufficient documentation

## 2016-01-19 DIAGNOSIS — N390 Urinary tract infection, site not specified: Secondary | ICD-10-CM

## 2016-01-19 DIAGNOSIS — W19XXXA Unspecified fall, initial encounter: Secondary | ICD-10-CM | POA: Diagnosis not present

## 2016-01-19 DIAGNOSIS — Y929 Unspecified place or not applicable: Secondary | ICD-10-CM | POA: Diagnosis not present

## 2016-01-19 DIAGNOSIS — Y999 Unspecified external cause status: Secondary | ICD-10-CM | POA: Insufficient documentation

## 2016-01-19 DIAGNOSIS — F0390 Unspecified dementia without behavioral disturbance: Secondary | ICD-10-CM | POA: Diagnosis not present

## 2016-01-19 DIAGNOSIS — Z043 Encounter for examination and observation following other accident: Secondary | ICD-10-CM | POA: Diagnosis present

## 2016-01-19 LAB — URINALYSIS, ROUTINE W REFLEX MICROSCOPIC
Bilirubin Urine: NEGATIVE
Glucose, UA: NEGATIVE mg/dL
Ketones, ur: NEGATIVE mg/dL
Nitrite: POSITIVE — AB
Protein, ur: NEGATIVE mg/dL
Specific Gravity, Urine: 1.025 (ref 1.005–1.030)
pH: 5.5 (ref 5.0–8.0)

## 2016-01-19 LAB — URINE MICROSCOPIC-ADD ON

## 2016-01-19 MED ORDER — SULFAMETHOXAZOLE-TRIMETHOPRIM 800-160 MG PO TABS
1.0000 | ORAL_TABLET | Freq: Once | ORAL | Status: AC
  Administered 2016-01-19: 1 via ORAL
  Filled 2016-01-19: qty 1

## 2016-01-19 MED ORDER — SULFAMETHOXAZOLE-TRIMETHOPRIM 800-160 MG PO TABS: 1.0000 | ORAL_TABLET | Freq: Two times a day (BID) | ORAL | 0 refills | Status: AC

## 2016-01-19 NOTE — ED Triage Notes (Signed)
Pt is a resident of [facility] in [place].  Ems was called out for pt who had fallen and was still on the floor when ems arrived.  Per ems, pt is not complaining of anything.

## 2016-01-19 NOTE — ED Provider Notes (Signed)
[facility]EM[facility]Y DEPT Provider Note   CSN: [id number] Arrival date & time: 01/19/16  2054  By signing my name below, I, Ruth Case, attest that this documentation has been prepared under the direction and in the presence of Raeford Razor, MD . Electronically Signed: Nelwyn Case, Scribe. 01/19/2016. 9:06 PM.  History   Chief Complaint Chief Complaint  Patient presents with  . Fall   LEVEL 5 CAVEAT DUE TO DEMENTIA   The history is provided by the patient. No language interpreter was used.    HPI Comments:  Ruth Case is a 82 y.o. female brought in by ambulance, who presents to the Emergency Department having fallen earlier today. EMS reports that they received a call for a woman who had fallen, and was still on the floor when they arrived. EMS indicates pt is not complaining of any acute problems or pain.  Past Medical History:  Diagnosis Date  . Arthritis   . Confusion   . GERD (gastroesophageal reflux disease)   . Glaucoma   . Hypertension   . Intractable hiccups? 09/16/2013  . Thyroid disease     Patient Active Problem List   Diagnosis Date Noted  . Osteoporosis 08/01/2015  . Alzheimer's disease 07/14/2015  . Intractable hiccups? 09/16/2013  . Solitary pulmonary nodule - calcified - suspect benign 09/09/2013  . Hypothyroidism 09/11/2012  . Essential hypertension, benign 09/11/2012  . Osteopenia 09/11/2012  . Nephrolithiasis 09/11/2012    History reviewed. No pertinent surgical history.  OB History    No data available       Home Medications    Prior to Admission medications   Medication Sig Start Date End Date Taking? Authorizing Provider  alendronate (FOSAMAX) 70 MG tablet Take 1 tablet (70 mg total) by mouth every 7 (seven) days. Take with a full glass of water on an empty stomach. 08/01/15   Junie Spencer, FNP  Calcium Carbonate-Vitamin D3 (CALCIUM 600/VITAMIN D) 600-400 MG-UNIT TABS Take 2 tablets by mouth daily. 08/01/15   Junie Spencer, FNP    diclofenac sodium (VOLTAREN) 1 % GEL Apply 2 g topically 4 (four) times daily as needed. To bilateral knees for pain as needed Patient not taking: Reported on 12/06/2015 08/01/15   Junie Spencer, FNP  donepezil (ARICEPT) 23 MG TABS tablet Take 1 tablet (23 mg total) by mouth at bedtime. 12/06/15   Mary-Margaret Daphine Deutscher, FNP  fluticasone (FLONASE) 50 MCG/ACT nasal spray Place 2 sprays into both nostrils daily. 06/07/15   Mary-Margaret Daphine Deutscher, FNP  levothyroxine (SYNTHROID, LEVOTHROID) 75 MCG tablet Take 1 tablet (75 mcg total) by mouth daily. 12/06/15   Mary-Margaret Daphine Deutscher, FNP  mirtazapine (REMERON) 30 MG tablet Take 1 tablet (30 mg total) by mouth at bedtime. 12/06/15   Mary-Margaret Daphine Deutscher, FNP  naproxen (NAPROSYN) 500 MG tablet Take BID for 1 week, then as BID prn for pain Patient not taking: Reported on 12/06/2015 08/01/15   Junie Spencer, FNP  solifenacin (VESICARE) 10 MG tablet Take 1 tablet (10 mg total) by mouth daily. 12/06/15   Mary-Margaret Daphine Deutscher, FNP  valsartan (DIOVAN) 80 MG tablet Take 1 tablet (80 mg total) by mouth daily. 06/07/15   Mary-Margaret Daphine Deutscher, FNP    Family History Family History  Problem Relation Age of Onset  . COPD Sister   . Stroke Brother   . Hypertension Brother     Social History Social History  Substance Use Topics  . Smoking status: Never Smoker  . Smokeless tobacco: Never Used  . Alcohol  use No     Allergies   Keflex [cephalexin] and Penicillins   Review of Systems Review of Systems  Unable to perform ROS: Dementia     Physical Exam Updated Vital Signs BP 157/79 (BP Location: Right Arm)   Pulse 67   Temp 97.4 F (36.3 C) (Oral)   Resp 18   SpO2 97%   Physical Exam  Constitutional: She appears well-developed and well-nourished. No distress.  HENT:  Head: Normocephalic and atraumatic.  Eyes: EOM are normal.  Neck: Normal range of motion.  Cardiovascular: Normal rate, regular rhythm and normal heart sounds.   Pulmonary/Chest:  Effort normal and breath sounds normal.  Abdominal: Soft. She exhibits no distension. There is no tenderness.  Musculoskeletal: Normal range of motion.  No external signs of acute trauma. No apparent pain with palpation of the extremities or apparent pain with range of motion large joints.  Neurological: She is alert.  Pleasantly confused. Follows very simple commands. No obvious focal motor deficit.   Skin: Skin is warm and dry.  Nursing note and vitals reviewed.    ED Treatments / Results  DIAGNOSTIC STUDIES:  Oxygen Saturation is 97% on RA, normal by my interpretation.    COORDINATION OF CARE:  9:14 PM Discussed treatment plan with pt at bedside and pt agreed to plan.  Labs (all labs ordered are listed, but only abnormal results are displayed) Labs Reviewed  URINALYSIS, ROUTINE W REFLEX MICROSCOPIC (NOT AT [facility]) - Abnormal; Notable for the following:       Result Value   Hgb urine dipstick MODERATE (*)    Nitrite POSITIVE (*)    Leukocytes, UA SMALL (*)    All other components within normal limits  URINE MICROSCOPIC-ADD ON - Abnormal; Notable for the following:    Squamous Epithelial / LPF 0-5 (*)    Bacteria, UA MANY (*)    All other components within normal limits    EKG  EKG Interpretation  Date/Time:  Thursday January 19 2016 21:51:22 EDT Ventricular Rate:  68 PR Interval:    QRS Duration: 82 QT Interval:  429 QTC Calculation: 457 R Axis:   4 Text Interpretation:  Sinus rhythm Prolonged PR interval Low voltage, precordial leads Confirmed by Juleen ChinaKOHUT  MD, Ivonne Freeburg ([id number]) on 01/19/2016 11:20:01 PM       Radiology No results found.  Procedures Procedures (including critical care time)  Medications Ordered in ED Medications - No data to display   Initial Impression / Assessment and Plan / ED Course  I have reviewed the triage vital signs and the nursing notes.  Pertinent labs & imaging results that were available during my care of the patient were reviewed  by me and considered in my medical decision making (see chart for details).  Clinical Course    82 year old female with unwitnessed fall. She has no acute complaints although her dementia makes review of systems unreliable. She has a reassuring exam. No external signs of trauma. Imaging was not performed. I did obtain an EKG and a urinalysis though. It does appear that she has urinary tract infection. This may continue to her fall. Will treat.  Final Clinical Impressions(s) / ED Diagnoses   Final diagnoses:  UTI (lower urinary tract infection)    New Prescriptions New Prescriptions   No medications on file    I personally preformed the services scribed in my presence. The recorded information has been reviewed is accurate. Raeford RazorStephen Cordera Stineman, MD.     Raeford RazorStephen Shameek Nyquist, MD 01/25/16 1346

## 2016-02-20 ENCOUNTER — Encounter: Payer: Self-pay | Admitting: Family Medicine

## 2016-02-20 ENCOUNTER — Ambulatory Visit (INDEPENDENT_AMBULATORY_CARE_PROVIDER_SITE_OTHER): Payer: Medicare Other | Admitting: Family Medicine

## 2016-02-20 VITALS — BP 135/75 | HR 75 | Temp 98.6°F | Ht 61.0 in | Wt 137.0 lb

## 2016-02-20 DIAGNOSIS — Z23 Encounter for immunization: Secondary | ICD-10-CM

## 2016-02-20 DIAGNOSIS — G308 Other Alzheimer's disease: Secondary | ICD-10-CM

## 2016-02-20 DIAGNOSIS — J069 Acute upper respiratory infection, unspecified: Secondary | ICD-10-CM | POA: Diagnosis not present

## 2016-02-20 DIAGNOSIS — F0281 Dementia in other diseases classified elsewhere with behavioral disturbance: Secondary | ICD-10-CM | POA: Diagnosis not present

## 2016-02-20 NOTE — Progress Notes (Signed)
Subjective:  Patient ID: Ruth Case, female    DOB: [birth year]  Age: 82 y.o. MRN: [id number]  CC: Cough (pt here today from cough and chest congestion, hoarseness)   HPI SHIRI HODAPP presents for Patient presents with upper respiratory congestion. She reports nasal congestion. There has been no rhinorrhea. She has been hoarse.. There is no history of sore throat. Patient gives minimal history. Most comes ffrom Daughter in law, reports she has not been coughing. However she has had some phlegm in her mouth. It looks a bit yellow at times. There is no fever no chills no sweats. The patient denies being short of breath. Onset was earlier today.   History Tuana has a past medical history of Arthritis; Confusion; GERD (gastroesophageal reflux disease); Glaucoma; Hypertension; Intractable hiccups? (09/16/2013); and Thyroid disease.   She has no past surgical history on file.   Her family history includes COPD in her sister; Hypertension in her brother; Stroke in her brother.She reports that she has never smoked. She has never used smokeless tobacco. She reports that she does not drink alcohol or use drugs.    ROS Review of Systems  Constitutional: Negative for activity change, appetite change, chills and fever.  HENT: Positive for congestion and postnasal drip. Negative for ear discharge, ear pain, hearing loss, nosebleeds, rhinorrhea, sinus pressure and sneezing.   Respiratory: Negative for shortness of breath.   Skin: Negative for rash.    Objective:  BP 135/75   Pulse 75   Temp 98.6 F (37 C) (Oral)   Ht '5\' 1"'$  (1.549 m)   Wt 137 lb (62.1 kg)   SpO2 98%   BMI 25.89 kg/m   BP Readings from Last 3 Encounters:  02/20/16 135/75  01/19/16 149/75  12/06/15 133/69    Wt Readings from Last 3 Encounters:  02/20/16 137 lb (62.1 kg)  12/06/15 134 lb (60.8 kg)  10/28/15 140 lb (63.5 kg)     Physical Exam  Constitutional: She appears well-developed and well-nourished.   HENT:  Head: Normocephalic and atraumatic.  Right Ear: Tympanic membrane and external ear normal. No decreased hearing is noted.  Left Ear: Tympanic membrane and external ear normal. No decreased hearing is noted.  Nose: Mucosal edema present. Right sinus exhibits no frontal sinus tenderness. Left sinus exhibits no frontal sinus tenderness.  Mouth/Throat: No oropharyngeal exudate or posterior oropharyngeal erythema.  Neck: No Brudzinski's sign noted.  Pulmonary/Chest: Breath sounds normal. No respiratory distress.  Lymphadenopathy:       Head (right side): No preauricular adenopathy present.       Head (left side): No preauricular adenopathy present.       Right cervical: No superficial cervical adenopathy present.      Left cervical: No superficial cervical adenopathy present.  Psychiatric: Her affect is blunt. Her speech is delayed. She is slowed. Cognition and memory are impaired.     Lab Results  Component Value Date   WBC 9.8 10/28/2015   HGB 12.5 10/28/2015   HCT 38.3 10/28/2015   PLT 235 10/28/2015   GLUCOSE 94 10/28/2015   CHOL 167 06/07/2015   TRIG 73 06/07/2015   HDL 84 06/07/2015   LDLCALC 68 06/07/2015   ALT 14 06/07/2015   AST 20 06/07/2015   NA 133 (L) 10/28/2015   K 4.9 10/28/2015   CL 99 (L) 10/28/2015   CREATININE 1.48 (H) 10/28/2015   BUN 34 (H) 10/28/2015   CO2 27 10/28/2015   TSH 3.860 05/10/2014  No results found.  Assessment & Plan:   Marilee was seen today for cough.  Diagnoses and all orders for this visit:  Alzheimer's disease of other onset with behavioral disturbance  Acute upper respiratory infection -     CBC with Differential/Platelet -     CMP14+EGFR    Symptoms appear to be viral versus allergic in origin. Monitor for worsening of condition including fever, worsening confusion, productive cough or purulent rhinorrhea.  For her symptoms she can take Tylenol [phone] milligrams 3 times daily. Salt water gargle as needed as  well.  I am having Ms. Nesheim maintain her valsartan, fluticasone, naproxen, diclofenac sodium, alendronate, Calcium Carbonate-Vitamin D3, donepezil, levothyroxine, mirtazapine, and solifenacin.  No orders of the defined types were placed in this encounter.    Follow-up: No Follow-up on file.  Claretta Fraise, M.D.

## 2016-02-21 LAB — CBC WITH DIFFERENTIAL/PLATELET
BASOS: 0 %
Basophils Absolute: 0.1 10*3/uL (ref 0.0–0.2)
EOS (ABSOLUTE): 0 10*3/uL (ref 0.0–0.4)
EOS: 0 %
HEMATOCRIT: 40 % (ref 34.0–46.6)
HEMOGLOBIN: 13.4 g/dL (ref 11.1–15.9)
Immature Grans (Abs): 0 10*3/uL (ref 0.0–0.1)
Immature Granulocytes: 0 %
LYMPHS ABS: 1.3 10*3/uL (ref 0.7–3.1)
Lymphs: 12 %
MCH: 28.7 pg (ref 26.6–33.0)
MCHC: 33.5 g/dL (ref 31.5–35.7)
MCV: 86 fL (ref 79–97)
MONOCYTES: 8 %
MONOS ABS: 0.9 10*3/uL (ref 0.1–0.9)
Neutrophils Absolute: 8.8 10*3/uL — ABNORMAL HIGH (ref 1.4–7.0)
Neutrophils: 80 %
Platelets: 271 10*3/uL (ref 150–379)
RBC: 4.67 x10E6/uL (ref 3.77–5.28)
RDW: 16.1 % — AB (ref 12.3–15.4)
WBC: 11.2 10*3/uL — ABNORMAL HIGH (ref 3.4–10.8)

## 2016-02-21 LAB — CMP14+EGFR
ALBUMIN: 4.1 g/dL (ref 3.5–4.7)
ALT: 13 IU/L (ref 0–32)
AST: 19 IU/L (ref 0–40)
Albumin/Globulin Ratio: 1.2 (ref 1.2–2.2)
Alkaline Phosphatase: 91 IU/L (ref 39–117)
BUN / CREAT RATIO: 19 (ref 12–28)
BUN: 22 mg/dL (ref 8–27)
Bilirubin Total: 0.3 mg/dL (ref 0.0–1.2)
CALCIUM: 9.8 mg/dL (ref 8.7–10.3)
CHLORIDE: 98 mmol/L (ref 96–106)
CO2: 24 mmol/L (ref 18–29)
Creatinine, Ser: 1.15 mg/dL — ABNORMAL HIGH (ref 0.57–1.00)
GFR calc non Af Amer: 43 mL/min/{1.73_m2} — ABNORMAL LOW (ref >59)
GFR, EST AFRICAN AMERICAN: 50 mL/min/{1.73_m2} — AB (ref >59)
GLOBULIN, TOTAL: 3.3 g/dL (ref 1.5–4.5)
Glucose: 79 mg/dL (ref 65–99)
POTASSIUM: 5 mmol/L (ref 3.5–5.2)
SODIUM: 139 mmol/L (ref 134–144)
Total Protein: 7.4 g/dL (ref 6.0–8.5)

## 2016-02-28 ENCOUNTER — Telehealth: Payer: Self-pay | Admitting: Nurse Practitioner

## 2016-02-29 NOTE — Telephone Encounter (Signed)
Notes faxed.

## 2016-03-20 ENCOUNTER — Ambulatory Visit (INDEPENDENT_AMBULATORY_CARE_PROVIDER_SITE_OTHER): Payer: Medicare Other | Admitting: Family Medicine

## 2016-03-20 DIAGNOSIS — M6281 Muscle weakness (generalized): Secondary | ICD-10-CM

## 2016-03-20 DIAGNOSIS — R279 Unspecified lack of coordination: Secondary | ICD-10-CM | POA: Diagnosis not present

## 2016-06-13 ENCOUNTER — Encounter (INDEPENDENT_AMBULATORY_CARE_PROVIDER_SITE_OTHER): Payer: Self-pay

## 2016-06-13 ENCOUNTER — Ambulatory Visit (INDEPENDENT_AMBULATORY_CARE_PROVIDER_SITE_OTHER): Payer: Medicare Other | Admitting: Family Medicine

## 2016-06-13 ENCOUNTER — Ambulatory Visit: Payer: Medicare Other | Admitting: Family Medicine

## 2016-06-13 ENCOUNTER — Encounter: Payer: Self-pay | Admitting: Family Medicine

## 2016-06-13 VITALS — BP 126/77 | HR 87 | Temp 98.5°F

## 2016-06-13 DIAGNOSIS — J209 Acute bronchitis, unspecified: Secondary | ICD-10-CM

## 2016-06-13 MED ORDER — DOXYCYCLINE HYCLATE 100 MG PO TABS: 100.0000 mg | ORAL_TABLET | Freq: Two times a day (BID) | ORAL | 0 refills | Status: DC

## 2016-06-13 NOTE — Progress Notes (Signed)
BP 126/77   Pulse 87   Temp 98.5 F (36.9 C) (Oral)    Subjective:    Patient ID: Ruth Case, female    DOB: [birth year], 83 y.o.   MRN: [id number]  HPI: Ruth Case is a 83 y.o. female presenting on 06/13/2016 for Cough (congestion, weakness)   HPI Cough and congestion and weakness  Patient has been having cough and congestion and weakness that has been going on since this morning. She is brought here by her daughter and one of the transport driver's from the [facility] care facility. They said that it really came on today and she's just been acting abnormally and talking less than she normally does and just acting generally ill and having a cough that is nonproductive. She is unable to verbalize a lot of her other symptoms because of her severe dementia so it is difficult to get a full history.  Relevant past medical, surgical, family and social history reviewed and updated as indicated. Interim medical history since our last visit reviewed. Allergies and medications reviewed and updated.  Review of Systems  Constitutional: Negative for fever.  HENT: Positive for congestion, sinus pressure and sore throat. Negative for sneezing.   Eyes: Negative for redness.  Cardiovascular: Negative for chest pain and leg swelling.  Skin: Negative for rash.  Psychiatric/Behavioral: Negative for agitation and behavioral problems.  All other systems reviewed and are negative.   Per HPI unless specifically indicated above   Allergies as of 06/13/2016      Reactions   Keflex [cephalexin]    Penicillins       Medication List       Accurate as of 06/13/16  3:14 PM. Always use your most recent med list.          alendronate 70 MG tablet Commonly known as:  FOSAMAX Take 1 tablet (70 mg total) by mouth every 7 (seven) days. Take with a full glass of water on an empty stomach.   Calcium Carbonate-Vitamin D3 600-400 MG-UNIT Tabs Commonly known as:  CALCIUM 600/VITAMIN D Take 2  tablets by mouth daily.   diclofenac sodium 1 % Gel Commonly known as:  VOLTAREN Apply 2 g topically 4 (four) times daily as needed. To bilateral knees for pain as needed   donepezil 23 MG Tabs tablet Commonly known as:  ARICEPT Take 1 tablet (23 mg total) by mouth at bedtime.   doxycycline 100 MG tablet Commonly known as:  VIBRA-TABS Take 1 tablet (100 mg total) by mouth 2 (two) times daily. 1 po bid   fluticasone 50 MCG/ACT nasal spray Commonly known as:  FLONASE Place 2 sprays into both nostrils daily.   levothyroxine 75 MCG tablet Commonly known as:  SYNTHROID, LEVOTHROID Take 1 tablet (75 mcg total) by mouth daily.   mirtazapine 30 MG tablet Commonly known as:  REMERON Take 1 tablet (30 mg total) by mouth at bedtime.   naproxen 500 MG tablet Commonly known as:  NAPROSYN Take BID for 1 week, then as BID prn for pain   solifenacin 10 MG tablet Commonly known as:  VESICARE Take 1 tablet (10 mg total) by mouth daily.   valsartan 80 MG tablet Commonly known as:  DIOVAN Take 1 tablet (80 mg total) by mouth daily.          Objective:    BP 126/77   Pulse 87   Temp 98.5 F (36.9 C) (Oral)   Wt Readings from Last 3 Encounters:  02/20/16  137 lb (62.1 kg)  12/06/15 134 lb (60.8 kg)  10/28/15 140 lb (63.5 kg)    Physical Exam  Constitutional: She is oriented to person, place, and time. She appears well-developed and well-nourished. No distress.  HENT:  Right Ear: Tympanic membrane, external ear and ear canal normal.  Left Ear: Tympanic membrane, external ear and ear canal normal.  Nose: Mucosal edema and rhinorrhea present. No epistaxis. Right sinus exhibits no maxillary sinus tenderness and no frontal sinus tenderness. Left sinus exhibits no maxillary sinus tenderness and no frontal sinus tenderness.  Mouth/Throat: Uvula is midline and mucous membranes are normal. Posterior oropharyngeal edema and posterior oropharyngeal erythema present. No oropharyngeal exudate  or tonsillar abscesses.  Eyes: Conjunctivae are normal.  Cardiovascular: Normal rate, regular rhythm, normal heart sounds and intact distal pulses.   No murmur heard. Pulmonary/Chest: Effort normal and breath sounds normal. No respiratory distress. She has no wheezes. She has no rales.  Musculoskeletal: Normal range of motion. She exhibits no edema or tenderness.  Neurological: She is alert and oriented to person, place, and time. Coordination normal.  Skin: Skin is warm and dry. No rash noted. She is not diaphoretic.  Psychiatric: She has a normal mood and affect. Her behavior is normal.  Vitals reviewed.     Assessment & Plan:   Problem List Items Addressed This Visit    None    Visit Diagnoses    Acute bronchitis, unspecified organism    -  Primary   Relevant Medications   doxycycline (VIBRA-TABS) 100 MG tablet       Follow up plan: Return if symptoms worsen or fail to improve.  Counseling provided for all of the vaccine components No orders of the defined types were placed in this encounter.   Arville Care, MD [facility] 06/13/2016, 3:14 PM

## 2016-07-05 ENCOUNTER — Emergency Department (HOSPITAL_COMMUNITY)
Admission: EM | Admit: 2016-07-05 | Discharge: 2016-07-05 | Disposition: A | Discharge status: Home / Self Care | Payer: Medicare Other | Attending: Emergency Medicine | Admitting: Emergency Medicine

## 2016-07-05 ENCOUNTER — Emergency Department (HOSPITAL_COMMUNITY): Payer: Medicare Other

## 2016-07-05 ENCOUNTER — Encounter (HOSPITAL_COMMUNITY): Payer: Self-pay | Admitting: Emergency Medicine

## 2016-07-05 DIAGNOSIS — S0083XA Contusion of other part of head, initial encounter: Secondary | ICD-10-CM | POA: Insufficient documentation

## 2016-07-05 DIAGNOSIS — Y939 Activity, unspecified: Secondary | ICD-10-CM | POA: Diagnosis not present

## 2016-07-05 DIAGNOSIS — S80211A Abrasion, right knee, initial encounter: Secondary | ICD-10-CM | POA: Insufficient documentation

## 2016-07-05 DIAGNOSIS — Z791 Long term (current) use of non-steroidal anti-inflammatories (NSAID): Secondary | ICD-10-CM | POA: Insufficient documentation

## 2016-07-05 DIAGNOSIS — S0990XA Unspecified injury of head, initial encounter: Secondary | ICD-10-CM | POA: Diagnosis present

## 2016-07-05 DIAGNOSIS — E039 Hypothyroidism, unspecified: Secondary | ICD-10-CM | POA: Diagnosis not present

## 2016-07-05 DIAGNOSIS — I10 Essential (primary) hypertension: Secondary | ICD-10-CM | POA: Insufficient documentation

## 2016-07-05 DIAGNOSIS — W19XXXA Unspecified fall, initial encounter: Secondary | ICD-10-CM

## 2016-07-05 DIAGNOSIS — G309 Alzheimer's disease, unspecified: Secondary | ICD-10-CM | POA: Diagnosis not present

## 2016-07-05 DIAGNOSIS — Z79899 Other long term (current) drug therapy: Secondary | ICD-10-CM | POA: Insufficient documentation

## 2016-07-05 DIAGNOSIS — Y929 Unspecified place or not applicable: Secondary | ICD-10-CM | POA: Diagnosis not present

## 2016-07-05 DIAGNOSIS — W050XXA Fall from non-moving wheelchair, initial encounter: Secondary | ICD-10-CM | POA: Insufficient documentation

## 2016-07-05 DIAGNOSIS — S60511A Abrasion of right hand, initial encounter: Secondary | ICD-10-CM | POA: Insufficient documentation

## 2016-07-05 DIAGNOSIS — Y999 Unspecified external cause status: Secondary | ICD-10-CM | POA: Diagnosis not present

## 2016-07-05 MED ORDER — BACITRACIN ZINC 500 UNIT/GM EX OINT
TOPICAL_OINTMENT | CUTANEOUS | Status: AC
  Filled 2016-07-05: qty 0.9

## 2016-07-05 NOTE — ED Notes (Signed)
Pt back from x-ray.

## 2016-07-05 NOTE — ED Notes (Signed)
Staff here to transport pt, report given. Pt dressed, caregiver given discharge instruction, verbalized understand. Patient wheelchair out of the department.

## 2016-07-05 NOTE — ED Triage Notes (Signed)
EMS reports that pt fell from w/c straight forward onto carpet and was witness by staff.  No LOC. Pt non-verbal base-line.

## 2016-07-05 NOTE — ED Provider Notes (Signed)
[facility] DEPT Provider Note   CSN: [id number] Arrival date & time: 07/05/16  0800     History   Chief Complaint Chief Complaint  Patient presents with  . Fall    HPI GISELLA ALWINE is a 83 y.o. female.  HPI   NAKIEA METZNER is a 83 y.o. female nursing home resident , with hx of dementia, HTN and she is non-verbal at baseline.  She presents to the Emergency Department after a forward fall from her wheelchair.  Occurred shortly before ED arrival. Per the nursing home staff report, the fall was witnessed by nursing home staff and she fell onto a carpeted floor.  No LOC.  Patient present with abrasions to her nose, forehead, right hand and right knee.  grimaces with ROM of the neck and right knee.  No reported blood thinners.      Past Medical History:  Diagnosis Date  . Arthritis   . Confusion   . GERD (gastroesophageal reflux disease)   . Glaucoma   . Hypertension   . Intractable hiccups? 09/16/2013  . Thyroid disease     Patient Active Problem List   Diagnosis Date Noted  . Osteoporosis 08/01/2015  . Alzheimer's disease 07/14/2015  . Intractable hiccups? 09/16/2013  . Solitary pulmonary nodule - calcified - suspect benign 09/09/2013  . Hypothyroidism 09/11/2012  . Essential hypertension, benign 09/11/2012  . Osteopenia 09/11/2012  . Nephrolithiasis 09/11/2012    History reviewed. No pertinent surgical history.  OB History    No data available       Home Medications    Prior to Admission medications   Medication Sig Start Date End Date Taking? Authorizing Provider  alendronate (FOSAMAX) 70 MG tablet Take 1 tablet (70 mg total) by mouth every 7 (seven) days. Take with a full glass of water on an empty stomach. 08/01/15   Junie Spencer, FNP  Calcium Carbonate-Vitamin D3 (CALCIUM 600/VITAMIN D) 600-400 MG-UNIT TABS Take 2 tablets by mouth daily. 08/01/15   Junie Spencer, FNP  diclofenac sodium (VOLTAREN) 1 % GEL Apply 2 g topically 4 (four) times  daily as needed. To bilateral knees for pain as needed 08/01/15   Junie Spencer, FNP  donepezil (ARICEPT) 23 MG TABS tablet Take 1 tablet (23 mg total) by mouth at bedtime. 12/06/15   Mary-Margaret Daphine Deutscher, FNP  doxycycline (VIBRA-TABS) 100 MG tablet Take 1 tablet (100 mg total) by mouth 2 (two) times daily. 1 po bid 06/13/16   Elige Radon Dettinger, MD  fluticasone (FLONASE) 50 MCG/ACT nasal spray Place 2 sprays into both nostrils daily. 06/07/15   Mary-Margaret Daphine Deutscher, FNP  levothyroxine (SYNTHROID, LEVOTHROID) 75 MCG tablet Take 1 tablet (75 mcg total) by mouth daily. 12/06/15   Mary-Margaret Daphine Deutscher, FNP  mirtazapine (REMERON) 30 MG tablet Take 1 tablet (30 mg total) by mouth at bedtime. 12/06/15   Mary-Margaret Daphine Deutscher, FNP  naproxen (NAPROSYN) 500 MG tablet Take BID for 1 week, then as BID prn for pain 08/01/15   Junie Spencer, FNP  solifenacin (VESICARE) 10 MG tablet Take 1 tablet (10 mg total) by mouth daily. 12/06/15   Mary-Margaret Daphine Deutscher, FNP  valsartan (DIOVAN) 80 MG tablet Take 1 tablet (80 mg total) by mouth daily. 06/07/15   Mary-Margaret Daphine Deutscher, FNP    Family History Family History  Problem Relation Age of Onset  . COPD Sister   . Stroke Brother   . Hypertension Brother     Social History Social History  Substance Use Topics  .  Smoking status: Never Smoker  . Smokeless tobacco: Never Used  . Alcohol use No     Allergies   Keflex [cephalexin] and Penicillins   Review of Systems Review of Systems  Unable to perform ROS: Patient nonverbal     Physical Exam Updated Vital Signs BP 141/69 (BP Location: Right Arm)   Pulse 64   Temp 97.6 F (36.4 C) (Axillary)   Resp 18   Ht 5\' 7"  (1.702 m)   Wt 62.1 kg   SpO2 100%   BMI 21.46 kg/m   Physical Exam  Constitutional: She is oriented to person, place, and time. She appears well-developed and well-nourished. No distress.  HENT:  Head: Normocephalic. Head is with abrasion.    Right Ear: External ear normal. No  hemotympanum.  Left Ear: External ear normal. No hemotympanum.  Nose: Sinus tenderness present. No mucosal edema, nose lacerations, nasal deformity, septal deviation or nasal septal hematoma. No epistaxis.  Mouth/Throat: Uvula is midline, oropharynx is clear and moist and mucous membranes are normal.  Abrasions to bridge of the nose and mid forehead.  No hematoma.  Bleeding controlled  Eyes: EOM are normal. Pupils are equal, round, and reactive to light.  Neck: Normal range of motion.  Cardiovascular: Normal rate, regular rhythm, normal heart sounds and intact distal pulses.   Pulmonary/Chest: Effort normal and breath sounds normal. No respiratory distress. She exhibits no tenderness.  Abdominal: Soft. She exhibits no distension and no mass. There is no tenderness. There is no guarding.  Musculoskeletal: Normal range of motion. She exhibits tenderness. She exhibits no edema or deformity.  Pt grimaces with ROM of the right knee and neck. No bony step off's over the spine.  No pelvic tenderness. Abrasions to the right anterior knee and dorsal right hand.   Neurological: She is alert and oriented to person, place, and time.  Skin: Skin is warm. No rash noted.  Nursing note and vitals reviewed.    ED Treatments / Results  Labs (all labs ordered are listed, but only abnormal results are displayed) Labs Reviewed - No data to display  EKG  EKG Interpretation None       Radiology Ct Head Wo Contrast  Result Date: 07/05/2016 CLINICAL DATA:  Fall from wheelchair. Abrasion to mid forehead and bridge of nose. EXAM: CT HEAD WITHOUT CONTRAST CT MAXILLOFACIAL WITHOUT CONTRAST CT CERVICAL SPINE WITHOUT CONTRAST TECHNIQUE: Multidetector CT imaging of the head, cervical spine, and maxillofacial structures were performed using the standard protocol without intravenous contrast. Multiplanar CT image reconstructions of the cervical spine and maxillofacial structures were also generated. COMPARISON:   05/03/2016 FINDINGS: CT HEAD FINDINGS Brain: There is atrophy and chronic small vessel disease changes. No acute intracranial abnormality. Specifically, no hemorrhage, hydrocephalus, mass lesion, acute infarction, or significant intracranial injury. Vascular: No hyperdense vessel or unexpected calcification. Skull: No acute calvarial abnormality. Other: Calcified structure in the left posterior scalp, likely calcified sebaceous cyst. CT MAXILLOFACIAL FINDINGS Osseous: No facial or orbital fracture. Mandible and zygomatic arches are intact. Orbits: Negative. No traumatic or inflammatory finding. Sinuses: Clear Soft tissues: Soft tissue swelling over the forehead. No underlying bony abnormality. CT CERVICAL SPINE FINDINGS Alignment: Normal Skull base and vertebrae: No fracture. Soft tissues and spinal canal: Prevertebral soft tissues are normal. No epidural or paraspinal hematoma. Disc levels: Diffuse disc space narrowing and spurring, most pronounced at C5-6. Upper chest: Negative Other: None IMPRESSION: Atrophy, chronic small vessel disease. No acute intracranial abnormality. No acute bony abnormality in the face or  cervical spine. Electronically Signed   By: Charlett Nose M.D.   On: 07/05/2016 09:40   Ct Cervical Spine Wo Contrast  Result Date: 07/05/2016 CLINICAL DATA:  Fall from wheelchair. Abrasion to mid forehead and bridge of nose. EXAM: CT HEAD WITHOUT CONTRAST CT MAXILLOFACIAL WITHOUT CONTRAST CT CERVICAL SPINE WITHOUT CONTRAST TECHNIQUE: Multidetector CT imaging of the head, cervical spine, and maxillofacial structures were performed using the standard protocol without intravenous contrast. Multiplanar CT image reconstructions of the cervical spine and maxillofacial structures were also generated. COMPARISON:  05/03/2016 FINDINGS: CT HEAD FINDINGS Brain: There is atrophy and chronic small vessel disease changes. No acute intracranial abnormality. Specifically, no hemorrhage, hydrocephalus, mass lesion,  acute infarction, or significant intracranial injury. Vascular: No hyperdense vessel or unexpected calcification. Skull: No acute calvarial abnormality. Other: Calcified structure in the left posterior scalp, likely calcified sebaceous cyst. CT MAXILLOFACIAL FINDINGS Osseous: No facial or orbital fracture. Mandible and zygomatic arches are intact. Orbits: Negative. No traumatic or inflammatory finding. Sinuses: Clear Soft tissues: Soft tissue swelling over the forehead. No underlying bony abnormality. CT CERVICAL SPINE FINDINGS Alignment: Normal Skull base and vertebrae: No fracture. Soft tissues and spinal canal: Prevertebral soft tissues are normal. No epidural or paraspinal hematoma. Disc levels: Diffuse disc space narrowing and spurring, most pronounced at C5-6. Upper chest: Negative Other: None IMPRESSION: Atrophy, chronic small vessel disease. No acute intracranial abnormality. No acute bony abnormality in the face or cervical spine. Electronically Signed   By: Charlett Nose M.D.   On: 07/05/2016 09:40   Dg Knee Complete 4 Views Right  Result Date: 07/05/2016 CLINICAL DATA:  Status post fall. EXAM: RIGHT KNEE - COMPLETE 4+ VIEW COMPARISON:  None. FINDINGS: Generalized osteopenia. No acute fracture or dislocation. Moderate medial femorotibial compartment joint space narrowing. Mild patellofemoral compartment osteoarthritis. Chondrocalcinosis of the medial and lateral femorotibial compartments as can be seen with CPPD. No significant joint effusion. IMPRESSION: 1. No acute osseous injury of the right knee. Electronically Signed   By: Elige Ko   On: 07/05/2016 09:57   Dg Hand Complete Right  Result Date: 07/05/2016 CLINICAL DATA:  Fall from wheelchair today, abrasion and bruising dorsal sore phase of the right hand EXAM: RIGHT HAND - COMPLETE 3+ VIEW COMPARISON:  None. FINDINGS: Three views of the right hand submitted. Study is limited by diffuse osteopenia. No definite acute fracture or subluxation.  There is probable old fracture deformity in distal right radius. Degenerative changes with narrowing of joint space radiocarpal joint space. Mild degenerative changes first carpometacarpal joint. Well corticated old fracture of the ulnar styloid. IMPRESSION: Limited study by diffuse osteopenia. No definite acute fracture or subluxation. Probable old fracture deformity of distal right radius. Narrowing of radiocarpal joint space. Probable old fracture with well corticated bony fragment ulnar-styloid. Degenerative changes first carpometacarpal joint. Electronically Signed   By: Natasha Mead M.D.   On: 07/05/2016 09:56   Dg Hips Bilat W Or Wo Pelvis 2 Views  Result Date: 07/05/2016 CLINICAL DATA:  Fall. EXAM: DG HIP (WITH OR WITHOUT PELVIS) 2V BILAT COMPARISON:  No recent prior. FINDINGS: Degenerative changes lumbar spine and both hips. Mild scoliosis concave right. Diffuse osteopenia. Aortoiliac atherosclerotic vascular calcification. Pelvic calcifications consistent phleboliths. Stool noted throughout the colon and rectum. IMPRESSION: 1. Diffuse osteopenia and degenerative changes. Scoliosis lumbar spine concave right. No acute bony abnormality. 2. Aortoiliac atherosclerotic-disease. 3. Amount of stool noted in the colon and rectum. Constipation cannot be excluded . Electronically Signed   By: Maisie Fus  Register  On: 07/05/2016 09:53   Ct Maxillofacial Wo Contrast  Result Date: 07/05/2016 CLINICAL DATA:  Fall from wheelchair. Abrasion to mid forehead and bridge of nose. EXAM: CT HEAD WITHOUT CONTRAST CT MAXILLOFACIAL WITHOUT CONTRAST CT CERVICAL SPINE WITHOUT CONTRAST TECHNIQUE: Multidetector CT imaging of the head, cervical spine, and maxillofacial structures were performed using the standard protocol without intravenous contrast. Multiplanar CT image reconstructions of the cervical spine and maxillofacial structures were also generated. COMPARISON:  05/03/2016 FINDINGS: CT HEAD FINDINGS Brain: There is atrophy  and chronic small vessel disease changes. No acute intracranial abnormality. Specifically, no hemorrhage, hydrocephalus, mass lesion, acute infarction, or significant intracranial injury. Vascular: No hyperdense vessel or unexpected calcification. Skull: No acute calvarial abnormality. Other: Calcified structure in the left posterior scalp, likely calcified sebaceous cyst. CT MAXILLOFACIAL FINDINGS Osseous: No facial or orbital fracture. Mandible and zygomatic arches are intact. Orbits: Negative. No traumatic or inflammatory finding. Sinuses: Clear Soft tissues: Soft tissue swelling over the forehead. No underlying bony abnormality. CT CERVICAL SPINE FINDINGS Alignment: Normal Skull base and vertebrae: No fracture. Soft tissues and spinal canal: Prevertebral soft tissues are normal. No epidural or paraspinal hematoma. Disc levels: Diffuse disc space narrowing and spurring, most pronounced at C5-6. Upper chest: Negative Other: None IMPRESSION: Atrophy, chronic small vessel disease. No acute intracranial abnormality. No acute bony abnormality in the face or cervical spine. Electronically Signed   By: Charlett NoseKevin  Dover M.D.   On: 07/05/2016 09:40    Procedures Procedures (including critical care time)  Medications Ordered in ED Medications - No data to display   Initial Impression / Assessment and Plan / ED Course  I have reviewed the triage vital signs and the nursing notes.  Pertinent labs & imaging results that were available during my care of the patient were reviewed by me and considered in my medical decision making (see chart for details).     Pt is well appearing.  Follows commands appropriately. Non-verbal at baseline.  Imaging today neg for fx's, or intracranial bleed.  Appears stable for d/c.  Abrasions cleaned and bandaged.  Pt also seen by Dr. Estell HarpinZammit and care plan discussed.   Final Clinical Impressions(s) / ED Diagnoses   Final diagnoses:  Contusion of face, initial encounter  Fall,  initial encounter    New Prescriptions New Prescriptions   No medications on file     Pauline Ausammy Julien Oscar, PA-C 07/05/16 1046    Bethann BerkshireJoseph Zammit, MD 07/05/16 1545

## 2016-07-05 NOTE — ED Triage Notes (Signed)
[facility]  Larey SeatFell this am.  Abrasions noted to forehead, bridge of nose and right hand (top of hand).  Pt is non-verbal (history).  EMS reports family was notified by facility.  VSS per EMS.  Fall was witnessed by staff per EMS.

## 2016-07-05 NOTE — ED Notes (Signed)
Band-aid to right knee.

## 2016-07-05 NOTE — ED Notes (Signed)
[facility] [place] [place] [facility]. Spoke with Marchelle FolksAmanda.

## 2016-07-05 NOTE — Discharge Instructions (Signed)
Tylenol every 4 hrs if needed for pain.  CT scans and X-rays today were negative for fractures or dislocations. Follow-up with your primary care provider.

## 2016-08-12 ENCOUNTER — Emergency Department (HOSPITAL_COMMUNITY)
Admission: EM | Admit: 2016-08-12 | Discharge: 2016-08-12 | Disposition: A | Discharge status: Home / Self Care | Payer: Medicare Other | Attending: Emergency Medicine | Admitting: Emergency Medicine

## 2016-08-12 ENCOUNTER — Encounter (HOSPITAL_COMMUNITY): Payer: Self-pay

## 2016-08-12 ENCOUNTER — Emergency Department (HOSPITAL_COMMUNITY): Payer: Medicare Other

## 2016-08-12 DIAGNOSIS — S0121XA Laceration without foreign body of nose, initial encounter: Secondary | ICD-10-CM

## 2016-08-12 DIAGNOSIS — Y999 Unspecified external cause status: Secondary | ICD-10-CM | POA: Insufficient documentation

## 2016-08-12 DIAGNOSIS — W050XXA Fall from non-moving wheelchair, initial encounter: Secondary | ICD-10-CM | POA: Diagnosis not present

## 2016-08-12 DIAGNOSIS — E039 Hypothyroidism, unspecified: Secondary | ICD-10-CM | POA: Insufficient documentation

## 2016-08-12 DIAGNOSIS — Z79899 Other long term (current) drug therapy: Secondary | ICD-10-CM | POA: Diagnosis not present

## 2016-08-12 DIAGNOSIS — W19XXXA Unspecified fall, initial encounter: Secondary | ICD-10-CM

## 2016-08-12 DIAGNOSIS — Y92129 Unspecified place in nursing home as the place of occurrence of the external cause: Secondary | ICD-10-CM | POA: Diagnosis not present

## 2016-08-12 DIAGNOSIS — F0390 Unspecified dementia without behavioral disturbance: Secondary | ICD-10-CM | POA: Insufficient documentation

## 2016-08-12 DIAGNOSIS — Y939 Activity, unspecified: Secondary | ICD-10-CM | POA: Diagnosis not present

## 2016-08-12 DIAGNOSIS — S0990XA Unspecified injury of head, initial encounter: Secondary | ICD-10-CM | POA: Diagnosis present

## 2016-08-12 DIAGNOSIS — I10 Essential (primary) hypertension: Secondary | ICD-10-CM | POA: Insufficient documentation

## 2016-08-12 DIAGNOSIS — Z23 Encounter for immunization: Secondary | ICD-10-CM | POA: Diagnosis not present

## 2016-08-12 HISTORY — DX: Unspecified dementia, unspecified severity, without behavioral disturbance, psychotic disturbance, mood disturbance, and anxiety: F03.90

## 2016-08-12 MED ORDER — LIDOCAINE HCL (PF) 1 % IJ SOLN
5.0000 mL | Freq: Once | INTRAMUSCULAR | Status: DC
  Filled 2016-08-12: qty 5

## 2016-08-12 MED ORDER — TETANUS-DIPHTH-ACELL PERTUSSIS 5-2.5-18.5 LF-MCG/0.5 IM SUSP
0.5000 mL | Freq: Once | INTRAMUSCULAR | Status: AC
Start: 2016-08-12 — End: 2016-08-12
  Administered 2016-08-12: 0.5 mL via INTRAMUSCULAR
  Filled 2016-08-12: qty 0.5

## 2016-08-12 NOTE — ED Notes (Signed)
Report from Robin, RN

## 2016-08-12 NOTE — ED Notes (Signed)
Ruth Case reports that she will call sons and that she will call back how to return pt to care home
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
## 2016-08-12 NOTE — ED Notes (Signed)
Multiple calls to [phone] without answer to the telephone  [place] [facility] [place] [facility] for assist to have someone there call here for discharge instructions

## 2016-08-12 NOTE — ED Provider Notes (Signed)
THIS IS A SHARED VISIT WITH DR. Juleen ChinaKOHUT.   PROCEDURE NOTE:  LACERATION REPAIR Performed by: Joley Utecht Authorized by: Ell Tiso Consent: Verbal consent obtained. Risks and benefits: risks, benefits and alternatives were discussed Consent given by: patient Patient identity confirmed: provided demographic data Prepped and Draped in normal sterile fashion Wound explored  Laceration Location: nasal bridge  Laceration Length: 2 cm  No Foreign Bodies seen or palpated  Anesthesia: local infiltration  Local anesthetic: lidocaine 1% without epinephrine  Anesthetic total: 2 ml  Irrigation method: syringe Amount of cleaning: standard  Skin closure: 6-0 Ethilon   Number of sutures: 4  Technique: simple interrupted  Patient tolerance: Patient tolerated the procedure well with no immediate complications.    [address] [place] Kristee Angus, [place]NP 08/12/16 1525    Raeford RazorStephen Kohut, MD 08/12/16 2001

## 2016-08-12 NOTE — Discharge Instructions (Signed)
Suture removal in 5-7 days

## 2016-08-12 NOTE — ED Provider Notes (Signed)
[facility]-EMER[facility]Y DEPT Provider Note   CSN: [id number] Arrival date & time: 08/12/16  1333   By signing my name below, I, Clovis Pu, attest that this documentation has been prepared under the direction and in the presence of Raeford Razor, MD  Electronically Signed: Clovis Pu, ED Scribe. 08/12/16. 2:05 PM.   History   Chief Complaint Chief Complaint  Patient presents with  . Fall  . Laceration    HPI Comments: LEVEL 5 CAVEAT DUE TO DEMENTIA   Ruth Case is a 83 y.o. female who presents to the Emergency Department, via EMS from [facility] nursing facility, complaining of acute onset laceration to her nose s/p an incident which occurred today. Per nursing facility, the pt was being pushed in a wheelchair by staff when her feet went under causing her to fall forward onto her face. No alleviating factors noted. No other associated symptoms noted.    The history is provided by the EMS personnel. No language interpreter was used.    Past Medical History:  Diagnosis Date  . Arthritis   . Confusion   . Dementia   . GERD (gastroesophageal reflux disease)   . Glaucoma   . Hypertension   . Intractable hiccups? 09/16/2013  . Thyroid disease     Patient Active Problem List   Diagnosis Date Noted  . Osteoporosis 08/01/2015  . Alzheimer's disease 07/14/2015  . Intractable hiccups? 09/16/2013  . Solitary pulmonary nodule - calcified - suspect benign 09/09/2013  . Hypothyroidism 09/11/2012  . Essential hypertension, benign 09/11/2012  . Osteopenia 09/11/2012  . Nephrolithiasis 09/11/2012    History reviewed. No pertinent surgical history.  OB History    No data available       Home Medications    Prior to Admission medications   Medication Sig Start Date End Date Taking? Authorizing Provider  alendronate (FOSAMAX) 70 MG tablet Take 1 tablet (70 mg total) by mouth every 7 (seven) days. Take with a full glass of water on an empty stomach. 08/01/15   Junie Spencer, FNP  Calcium Carbonate-Vitamin D3 (CALCIUM 600/VITAMIN D) 600-400 MG-UNIT TABS Take 2 tablets by mouth daily. 08/01/15   Junie Spencer, FNP  diclofenac sodium (VOLTAREN) 1 % GEL Apply 2 g topically 4 (four) times daily as needed. To bilateral knees for pain as needed 08/01/15   Junie Spencer, FNP  donepezil (ARICEPT) 23 MG TABS tablet Take 1 tablet (23 mg total) by mouth at bedtime. 12/06/15   Mary-Margaret Daphine Deutscher, FNP  doxycycline (VIBRA-TABS) 100 MG tablet Take 1 tablet (100 mg total) by mouth 2 (two) times daily. 1 po bid Patient not taking: Reported on 07/05/2016 06/13/16   Elige Radon Dettinger, MD  fluticasone (FLONASE) 50 MCG/ACT nasal spray Place 2 sprays into both nostrils daily. 06/07/15   Mary-Margaret Daphine Deutscher, FNP  latanoprost (XALATAN) 0.005 % ophthalmic solution Place 1 drop into both eyes at bedtime.    Historical Provider, MD  levothyroxine (SYNTHROID, LEVOTHROID) 75 MCG tablet Take 1 tablet (75 mcg total) by mouth daily. 12/06/15   Mary-Margaret Daphine Deutscher, FNP  mirtazapine (REMERON) 30 MG tablet Take 1 tablet (30 mg total) by mouth at bedtime. 12/06/15   Mary-Margaret Daphine Deutscher, FNP  naproxen (NAPROSYN) 500 MG tablet Take BID for 1 week, then as BID prn for pain 08/01/15   Junie Spencer, FNP  polyethylene glycol powder (GLYCOLAX/MIRALAX) powder Take 17 g by mouth once.    Historical Provider, MD  solifenacin (VESICARE) 10 MG tablet Take 1 tablet (  10 mg total) by mouth daily. 12/06/15   Mary-Margaret Daphine DeutscherMartin, FNP  valsartan (DIOVAN) 80 MG tablet Take 1 tablet (80 mg total) by mouth daily. 06/07/15   Mary-Margaret Daphine DeutscherMartin, FNP    Family History Family History  Problem Relation Age of Onset  . COPD Sister   . Stroke Brother   . Hypertension Brother     Social History Social History  Substance Use Topics  . Smoking status: Never Smoker  . Smokeless tobacco: Never Used  . Alcohol use No     Allergies   Keflex [cephalexin] and Penicillins   Review of Systems Review of Systems    Unable to perform ROS: Dementia   Physical Exam Updated Vital Signs BP (!) 157/83 (BP Location: Left Arm)   Pulse 79   Temp 98.2 F (36.8 C) (Oral)   Resp 16   Wt 137 lb (62.1 kg)   SpO2 100%   BMI 21.46 kg/m   Physical Exam  Constitutional: She is oriented to person, place, and time. She appears well-developed and well-nourished. No distress.  HENT:  Head: Normocephalic.  1.5 cm laceration across the bridge of the nose. Abrasion to forehead.   Eyes: EOM are normal.  Neck: Normal range of motion.  Cardiovascular: Normal rate, regular rhythm and normal heart sounds.   Pulmonary/Chest: Effort normal and breath sounds normal.  Abdominal: Soft. She exhibits no distension. There is no tenderness.  Musculoskeletal: Normal range of motion. She exhibits no tenderness.  No midline spinal tenderness. No apparent pain with ROM of large joints.   Neurological: She is alert and oriented to person, place, and time.  Skin: Skin is warm and dry.  Psychiatric: She has a normal mood and affect. Judgment normal.  Nursing note and vitals reviewed.    ED Treatments / Results  DIAGNOSTIC STUDIES:  Oxygen Saturation is 100% on RA, normal by my interpretation.    COORDINATION OF CARE:  2:03 PM Discussed treatment plan with pt at bedside and pt agreed to plan.  Labs (all labs ordered are listed, but only abnormal results are displayed) Labs Reviewed - No data to display  EKG  EKG Interpretation None       Radiology Ct Head Wo Contrast  Result Date: 08/12/2016 CLINICAL DATA:  83 year old female status post fall out of wheelchair. Laceration on the bridge of the nose. No loss of consciousness. Initial encounter. EXAM: CT HEAD WITHOUT CONTRAST TECHNIQUE: Contiguous axial images were obtained from the base of the skull through the vertex without intravenous contrast. COMPARISON:  Head face and cervical spine CT 07/05/2016 and earlier. FINDINGS: Brain: Cerebral volume is stable since  2017. Stable gray-white matter differentiation throughout the brain, patchy bilateral white matter hypodensity. Prominent left inferior lentiform region perivascular space. 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 No midline shift, ventriculomegaly, mass effect, evidence of mass lesion, intracranial hemorrhage or evidence of cortically based acute infarction. Vascular: Calcified atherosclerosis at the skull base. No suspicious intracranial vascular hyperdensity. Skull: Calvarium appears stable and intact. No acute nasal bone fracture identified. Visible facial bones appear stable and intact. No acute osseous abnormality identified. Sinuses/Orbits: Visualized paranasal sinuses and mastoids are stable and well pneumatized. Other: Broad-based right paracentral forehead scalp hematoma up to 7 mm in thickness. Underlying frontal bones are intact. No other acute scalp soft tissue finding; chronic left occipital region 2.7 cm scalp/dermal cyst with speckled calcifications remain stable. Orbits soft tissues are stable and within normal limits. Negative visualized noncontrast deep soft  tissue spaces of the face. IMPRESSION: 1. Forehead scalp hematoma without underlying fracture. 2. Stable brain.  No acute intracranial abnormality. Electronically Signed   By: Odessa Fleming M.D.   On: 08/12/2016 14:48    Procedures Procedures (including critical care time)  Medications Ordered in ED Medications - No data to display   Initial Impression / Assessment and Plan / ED Course  I have reviewed the triage vital signs and the nursing notes.  Pertinent labs & imaging results that were available during my care of the patient were reviewed by me and considered in my medical decision making (see chart for details).     83 year old female presenting after a fall. She is at her reported baseline in terms her mental status. She does have evidence of some facial/head trauma. CT of the head does not show any  acute cranial or intracranial injury. She sustained a laceration to her bridge of her nose which was repaired (see note by Kerrie Buffalo). No midline spinal tenderness. Is able to range her extremities without any apparent discomfort. I feel she is appropriate to discharge back to her facility.  Final Clinical Impressions(s) / ED Diagnoses   Final diagnoses:  Fall, initial encounter  Laceration of nose, initial encounter    New Prescriptions New Prescriptions   No medications on file   I personally preformed the services scribed in my presence. The recorded information has been reviewed is accurate. Raeford Razor, MD.     Raeford Razor, MD 08/12/16 [phone]

## 2016-08-12 NOTE — ED Notes (Signed)
Call to Darrol JumpLatoya George care giver and discharge instructions reviewed with staff  Glee ArvinLatoya reports that the only phone number for the facility is [phone]

## 2016-08-17 ENCOUNTER — Ambulatory Visit (INDEPENDENT_AMBULATORY_CARE_PROVIDER_SITE_OTHER): Payer: Medicare Other | Admitting: Pediatrics

## 2016-08-17 DIAGNOSIS — M859 Disorder of bone density and structure, unspecified: Secondary | ICD-10-CM

## 2016-08-17 DIAGNOSIS — H409 Unspecified glaucoma: Secondary | ICD-10-CM | POA: Diagnosis not present

## 2016-08-17 DIAGNOSIS — G309 Alzheimer's disease, unspecified: Secondary | ICD-10-CM

## 2016-08-17 DIAGNOSIS — F0280 Dementia in other diseases classified elsewhere without behavioral disturbance: Secondary | ICD-10-CM

## 2016-08-17 DIAGNOSIS — E039 Hypothyroidism, unspecified: Secondary | ICD-10-CM

## 2016-08-17 DIAGNOSIS — J208 Acute bronchitis due to other specified organisms: Secondary | ICD-10-CM | POA: Diagnosis not present

## 2016-08-17 DIAGNOSIS — I10 Essential (primary) hypertension: Secondary | ICD-10-CM | POA: Diagnosis not present

## 2016-08-17 DIAGNOSIS — M1990 Unspecified osteoarthritis, unspecified site: Secondary | ICD-10-CM

## 2016-08-17 DIAGNOSIS — M6281 Muscle weakness (generalized): Secondary | ICD-10-CM

## 2016-09-06 ENCOUNTER — Observation Stay (HOSPITAL_COMMUNITY)
Admission: EM | Admit: 2016-09-06 | Discharge: 2016-09-07 | Disposition: A | Discharge status: Home / Self Care | Payer: Medicare Other | Attending: Internal Medicine | Admitting: Internal Medicine

## 2016-09-06 ENCOUNTER — Encounter (HOSPITAL_COMMUNITY): Payer: Self-pay

## 2016-09-06 ENCOUNTER — Emergency Department (HOSPITAL_COMMUNITY): Payer: Medicare Other

## 2016-09-06 DIAGNOSIS — F0280 Dementia in other diseases classified elsewhere without behavioral disturbance: Secondary | ICD-10-CM | POA: Diagnosis not present

## 2016-09-06 DIAGNOSIS — Z79899 Other long term (current) drug therapy: Secondary | ICD-10-CM | POA: Diagnosis not present

## 2016-09-06 DIAGNOSIS — Z5181 Encounter for therapeutic drug level monitoring: Secondary | ICD-10-CM | POA: Diagnosis not present

## 2016-09-06 DIAGNOSIS — I2699 Other pulmonary embolism without acute cor pulmonale: Secondary | ICD-10-CM | POA: Diagnosis not present

## 2016-09-06 DIAGNOSIS — I2602 Saddle embolus of pulmonary artery with acute cor pulmonale: Secondary | ICD-10-CM | POA: Diagnosis not present

## 2016-09-06 DIAGNOSIS — R4182 Altered mental status, unspecified: Secondary | ICD-10-CM | POA: Insufficient documentation

## 2016-09-06 DIAGNOSIS — I10 Essential (primary) hypertension: Secondary | ICD-10-CM | POA: Insufficient documentation

## 2016-09-06 DIAGNOSIS — Z7189 Other specified counseling: Secondary | ICD-10-CM

## 2016-09-06 DIAGNOSIS — G309 Alzheimer's disease, unspecified: Secondary | ICD-10-CM | POA: Insufficient documentation

## 2016-09-06 DIAGNOSIS — E039 Hypothyroidism, unspecified: Secondary | ICD-10-CM | POA: Insufficient documentation

## 2016-09-06 DIAGNOSIS — G301 Alzheimer's disease with late onset: Secondary | ICD-10-CM

## 2016-09-06 DIAGNOSIS — I2609 Other pulmonary embolism with acute cor pulmonale: Secondary | ICD-10-CM | POA: Diagnosis not present

## 2016-09-06 DIAGNOSIS — R230 Cyanosis: Secondary | ICD-10-CM | POA: Diagnosis present

## 2016-09-06 DIAGNOSIS — L8990 Pressure ulcer of unspecified site, unspecified stage: Secondary | ICD-10-CM | POA: Insufficient documentation

## 2016-09-06 DIAGNOSIS — Z515 Encounter for palliative care: Secondary | ICD-10-CM

## 2016-09-06 LAB — COMPREHENSIVE METABOLIC PANEL
ALBUMIN: 3.3 g/dL — AB (ref 3.5–5.0)
ALT: 12 U/L — AB (ref 14–54)
ANION GAP: 10 (ref 5–15)
AST: 17 U/L (ref 15–41)
Alkaline Phosphatase: 82 U/L (ref 38–126)
BUN: 26 mg/dL — ABNORMAL HIGH (ref 6–20)
CALCIUM: 9.6 mg/dL (ref 8.9–10.3)
CHLORIDE: 104 mmol/L (ref 101–111)
CO2: 31 mmol/L (ref 22–32)
CREATININE: 1 mg/dL (ref 0.44–1.00)
GFR, EST AFRICAN AMERICAN: 57 mL/min — AB (ref >60)
GFR, EST NON AFRICAN AMERICAN: 50 mL/min — AB (ref >60)
Glucose, Bld: 91 mg/dL (ref 65–99)
Potassium: 4.1 mmol/L (ref 3.5–5.1)
Sodium: 145 mmol/L (ref 135–145)
Total Bilirubin: 0.6 mg/dL (ref 0.3–1.2)
Total Protein: 8.3 g/dL — ABNORMAL HIGH (ref 6.5–8.1)

## 2016-09-06 LAB — CBC WITH DIFFERENTIAL/PLATELET
BASOS ABS: 0 10*3/uL (ref 0.0–0.1)
Basophils Relative: 0 %
EOS ABS: 0.4 10*3/uL (ref 0.0–0.7)
EOS PCT: 5 %
HCT: 39 % (ref 36.0–46.0)
Hemoglobin: 11.6 g/dL — ABNORMAL LOW (ref 12.0–15.0)
Lymphocytes Relative: 11 %
Lymphs Abs: 1 10*3/uL (ref 0.7–4.0)
MCH: 22.7 pg — ABNORMAL LOW (ref 26.0–34.0)
MCHC: 29.7 g/dL — ABNORMAL LOW (ref 30.0–36.0)
MCV: 76.5 fL — ABNORMAL LOW (ref 78.0–100.0)
Monocytes Absolute: 0.6 10*3/uL (ref 0.1–1.0)
Monocytes Relative: 7 %
NEUTROS PCT: 77 %
Neutro Abs: 6.7 10*3/uL (ref 1.7–7.7)
PLATELETS: 448 10*3/uL — AB (ref 150–400)
RBC: 5.1 MIL/uL (ref 3.87–5.11)
RDW: 17.5 % — ABNORMAL HIGH (ref 11.5–15.5)
WBC: 8.8 10*3/uL (ref 4.0–10.5)

## 2016-09-06 LAB — I-STAT CG4 LACTIC ACID, ED: LACTIC ACID, VENOUS: 1.85 mmol/L (ref 0.5–1.9)

## 2016-09-06 LAB — MRSA PCR SCREENING: MRSA by PCR: INVALID — AB

## 2016-09-06 LAB — URINALYSIS, ROUTINE W REFLEX MICROSCOPIC
BILIRUBIN URINE: NEGATIVE
Bacteria, UA: NONE SEEN
Glucose, UA: NEGATIVE mg/dL
Ketones, ur: NEGATIVE mg/dL
Leukocytes, UA: NEGATIVE
Nitrite: NEGATIVE
PH: 6 (ref 5.0–8.0)
Protein, ur: NEGATIVE mg/dL
SPECIFIC GRAVITY, URINE: 1.021 (ref 1.005–1.030)

## 2016-09-06 LAB — BLOOD GAS, ARTERIAL
Acid-Base Excess: 5.8 mmol/L — ABNORMAL HIGH (ref 0.0–2.0)
Bicarbonate: 29.7 mmol/L — ABNORMAL HIGH (ref 20.0–28.0)
DRAWN BY: 277331
FIO2: 0.21
O2 Saturation: 96.3 %
PATIENT TEMPERATURE: 37
pCO2 arterial: 38.8 mmHg (ref 32.0–48.0)
pH, Arterial: 7.49 — ABNORMAL HIGH (ref 7.350–7.450)
pO2, Arterial: 82 mmHg — ABNORMAL LOW (ref 83.0–108.0)

## 2016-09-06 LAB — APTT

## 2016-09-06 LAB — TROPONIN I

## 2016-09-06 MED ORDER — ONDANSETRON HCL 4 MG PO TABS: 4.0000 mg | ORAL_TABLET | Freq: Four times a day (QID) | ORAL | Status: DC | PRN

## 2016-09-06 MED ORDER — DONEPEZIL HCL 23 MG PO TABS
23.0000 mg | ORAL_TABLET | Freq: Every day | ORAL | Status: DC
  Filled 2016-09-06 (×4): qty 1

## 2016-09-06 MED ORDER — POLYETHYLENE GLYCOL 3350 17 G PO PACK
17.0000 g | PACK | Freq: Every day | ORAL | Status: DC
  Administered 2016-09-06 – 2016-09-07 (×2): 17 g via ORAL
  Filled 2016-09-06 (×3): qty 1

## 2016-09-06 MED ORDER — SODIUM CHLORIDE 0.9% FLUSH
3.0000 mL | Freq: Two times a day (BID) | INTRAVENOUS | Status: DC
  Administered 2016-09-06: 3 mL via INTRAVENOUS

## 2016-09-06 MED ORDER — DARIFENACIN HYDROBROMIDE ER 7.5 MG PO TB24
7.5000 mg | ORAL_TABLET | Freq: Every day | ORAL | Status: DC
  Administered 2016-09-07: 7.5 mg via ORAL
  Filled 2016-09-06: qty 1

## 2016-09-06 MED ORDER — SENNOSIDES-DOCUSATE SODIUM 8.6-50 MG PO TABS: 1.0000 | ORAL_TABLET | Freq: Every evening | ORAL | Status: DC | PRN

## 2016-09-06 MED ORDER — HEPARIN (PORCINE) IN NACL 100-0.45 UNIT/ML-% IJ SOLN
1000.0000 [IU]/h | INTRAMUSCULAR | Status: DC
  Administered 2016-09-06: 1000 [IU]/h via INTRAVENOUS
  Filled 2016-09-06: qty 250

## 2016-09-06 MED ORDER — ACETAMINOPHEN 650 MG RE SUPP: 650.0000 mg | Freq: Four times a day (QID) | RECTAL | Status: DC | PRN

## 2016-09-06 MED ORDER — ONDANSETRON HCL 4 MG/2ML IJ SOLN: 4.0000 mg | Freq: Four times a day (QID) | INTRAMUSCULAR | Status: DC | PRN

## 2016-09-06 MED ORDER — HEPARIN BOLUS VIA INFUSION
5000.0000 [IU] | Freq: Once | INTRAVENOUS | Status: AC
  Administered 2016-09-06: 5000 [IU] via INTRAVENOUS

## 2016-09-06 MED ORDER — KETOROLAC TROMETHAMINE 15 MG/ML IJ SOLN: 15.0000 mg | Freq: Four times a day (QID) | INTRAMUSCULAR | Status: DC | PRN

## 2016-09-06 MED ORDER — MIRTAZAPINE 30 MG PO TABS
30.0000 mg | ORAL_TABLET | Freq: Every day | ORAL | Status: DC
  Administered 2016-09-06: 30 mg via ORAL
  Filled 2016-09-06: qty 1

## 2016-09-06 MED ORDER — SODIUM CHLORIDE 0.9% FLUSH: 3.0000 mL | INTRAVENOUS | Status: DC | PRN

## 2016-09-06 MED ORDER — IOPAMIDOL (ISOVUE-370) INJECTION 76%
75.0000 mL | Freq: Once | INTRAVENOUS | Status: AC | PRN
  Administered 2016-09-06: 75 mL via INTRAVENOUS

## 2016-09-06 MED ORDER — LATANOPROST 0.005 % OP SOLN
1.0000 [drp] | Freq: Every day | OPHTHALMIC | Status: DC
  Administered 2016-09-06: 1 [drp] via OPHTHALMIC
  Filled 2016-09-06: qty 2.5

## 2016-09-06 MED ORDER — ACETAMINOPHEN 325 MG PO TABS: 650.0000 mg | ORAL_TABLET | Freq: Four times a day (QID) | ORAL | Status: DC | PRN

## 2016-09-06 MED ORDER — IRBESARTAN 75 MG PO TABS
75.0000 mg | ORAL_TABLET | Freq: Every day | ORAL | Status: DC
  Administered 2016-09-07: 75 mg via ORAL
  Filled 2016-09-06: qty 1

## 2016-09-06 MED ORDER — SODIUM CHLORIDE 0.9 % IV SOLN: 250.0000 mL | INTRAVENOUS | Status: DC | PRN

## 2016-09-06 MED ORDER — CALCIUM CARBONATE-VITAMIN D 500-200 MG-UNIT PO TABS
2.0000 | ORAL_TABLET | Freq: Every day | ORAL | Status: DC
  Administered 2016-09-07: 2 via ORAL
  Filled 2016-09-06: qty 2

## 2016-09-06 NOTE — ED Notes (Signed)
Pt returned from CT °

## 2016-09-06 NOTE — ED Notes (Signed)
Pt transported to CT ?

## 2016-09-06 NOTE — ED Provider Notes (Signed)
[facility] Provider Note   CSN: [id number] Arrival date & time: 09/06/16  4098     History   Chief Complaint Chief Complaint  Patient presents with  . Altered Mental Status  . Cyanosis    HPI Ruth Case is a 83 y.o. female.  The history is provided by the nursing home, the EMS personnel and a relative. The history is limited by the condition of the patient (Hx dementia).  Altered Mental Status      Pt was seen at 1000.  Per EMS, NH report and family, c/o pt with several days of being "more confused than usual" and "not as active as usual" for the past 3 days. Pt has hx of dementia, needs assistance with most (if not all) activities, is mostly wheelchair bound, occasionally is able to feed herself. NH states pt's fingernails "have been blue." No reported falls, no fevers, no vomiting/diarrhea, no SOB/cough.   Past Medical History:  Diagnosis Date  . Arthritis   . Confusion   . Dementia   . GERD (gastroesophageal reflux disease)   . Glaucoma   . Hypertension   . Intractable hiccups? 09/16/2013  . Thyroid disease     Patient Active Problem List   Diagnosis Date Noted  . Osteoporosis 08/01/2015  . Alzheimer's disease 07/14/2015  . Intractable hiccups? 09/16/2013  . Solitary pulmonary nodule - calcified - suspect benign 09/09/2013  . Hypothyroidism 09/11/2012  . Essential hypertension, benign 09/11/2012  . Osteopenia 09/11/2012  . Nephrolithiasis 09/11/2012    History reviewed. No pertinent surgical history.  OB History    No data available       Home Medications    Prior to Admission medications   Medication Sig Start Date End Date Taking? Authorizing Provider  alendronate (FOSAMAX) 70 MG tablet Take 1 tablet (70 mg total) by mouth every 7 (seven) days. Take with a full glass of water on an empty stomach. 08/01/15   Junie Spencer, FNP  Calcium Carbonate-Vitamin D3 (CALCIUM 600/VITAMIN D) 600-400 MG-UNIT TABS Take 2 tablets by mouth daily.  08/01/15   Junie Spencer, FNP  diclofenac sodium (VOLTAREN) 1 % GEL Apply 2 g topically 4 (four) times daily as needed. To bilateral knees for pain as needed 08/01/15   Junie Spencer, FNP  donepezil (ARICEPT) 23 MG TABS tablet Take 1 tablet (23 mg total) by mouth at bedtime. 12/06/15   Mary-Margaret Daphine Deutscher, FNP  doxycycline (VIBRA-TABS) 100 MG tablet Take 1 tablet (100 mg total) by mouth 2 (two) times daily. 1 po bid Patient not taking: Reported on 07/05/2016 06/13/16   Elige Radon Dettinger, MD  fluticasone (FLONASE) 50 MCG/ACT nasal spray Place 2 sprays into both nostrils daily. 06/07/15   Mary-Margaret Daphine Deutscher, FNP  latanoprost (XALATAN) 0.005 % ophthalmic solution Place 1 drop into both eyes at bedtime.    Historical Provider, MD  levothyroxine (SYNTHROID, LEVOTHROID) 75 MCG tablet Take 1 tablet (75 mcg total) by mouth daily. 12/06/15   Mary-Margaret Daphine Deutscher, FNP  mirtazapine (REMERON) 30 MG tablet Take 1 tablet (30 mg total) by mouth at bedtime. 12/06/15   Mary-Margaret Daphine Deutscher, FNP  naproxen (NAPROSYN) 500 MG tablet Take BID for 1 week, then as BID prn for pain 08/01/15   Junie Spencer, FNP  polyethylene glycol powder (GLYCOLAX/MIRALAX) powder Take 17 g by mouth once.    Historical Provider, MD  solifenacin (VESICARE) 10 MG tablet Take 1 tablet (10 mg total) by mouth daily. 12/06/15   Mary-Margaret Daphine Deutscher, FNP  valsartan (DIOVAN) 80 MG tablet Take 1 tablet (80 mg total) by mouth daily. 06/07/15   Mary-Margaret Daphine Deutscher, FNP    Family History Family History  Problem Relation Age of Onset  . COPD Sister   . Stroke Brother   . Hypertension Brother     Social History Social History  Substance Use Topics  . Smoking status: Never Smoker  . Smokeless tobacco: Never Used  . Alcohol use No     Allergies   Keflex [cephalexin] and Penicillins   Review of Systems Review of Systems  Unable to perform ROS: Dementia     Physical Exam Updated Vital Signs BP 137/60 (BP Location: Left Arm)    Pulse 66   Temp 97.7 F (36.5 C) (Oral)   Resp 20   Wt 137 lb (62.1 kg)   SpO2 100%   BMI 21.46 kg/m   Physical Exam 1005: Physical examination:  Nursing notes reviewed; Vital signs and O2 SAT reviewed;  Constitutional: Well developed, Well nourished, In no acute distress; Head:  Normocephalic, atraumatic; Eyes: EOMI, PERRL, No scleral icterus; ENMT: Mouth and pharynx normal, Mucous membranes dry; Neck: Supple, Full range of motion, No lymphadenopathy; Cardiovascular: Regular rate and rhythm, No gallop; Respiratory: Breath sounds clear & equal bilaterally, No wheezes. Normal respiratory effort/excursion; Chest: Nontender, Movement normal; Abdomen: Soft, Nontender, Nondistended, Normal bowel sounds; Genitourinary: No CVA tenderness; Extremities: Pulses normal, No tenderness, +1 pedal edema bilat. No calf asymmetry.; Neuro: Awake, alert. Speaks few words. No facial droop. Grips equal. Moves all extremities on stretcher spontaneously without apparent gross focal motor deficits.; Skin: Color normal, Warm, Dry, few fingernail beds faintly cyanotic, toenails without cyanosis.    ED Treatments / Results  Labs (all labs ordered are listed, but only abnormal results are displayed)   EKG  EKG Interpretation  Date/Time:  Thursday September 06 2016 09:46:13 EDT Ventricular Rate:  73 PR Interval:    QRS Duration: 79 QT Interval:  425 QTC Calculation: 469 R Axis:   52 Text Interpretation:  Sinus rhythm When compared with ECG of 01/19/2016 No significant change was found Confirmed by [facility]  MD, Nicholos Johns [phone]) on 09/06/2016 10:31:22 AM       Radiology  Procedures Procedures (including critical care time)  Medications Ordered in ED Medications - No data to display   Initial Impression / Assessment and Plan / ED Course  I have reviewed the triage vital signs and the nursing notes.  Pertinent labs & imaging results that were available during my care of the patient were reviewed by me and  considered in my medical decision making (see chart for details).  MDM Reviewed: previous chart, nursing note and vitals Reviewed previous: labs and ECG Interpretation: labs, ECG, x-ray and CT scan Total time providing critical care: 30-74 minutes. This excludes time spent performing separately reportable procedures and services. Consults: admitting MD   CRITICAL CARE Performed by: Laray Anger Total critical care time: 35 minutes Critical care time was exclusive of separately billable procedures and treating other patients. Critical care was necessary to treat or prevent imminent or life-threatening deterioration. Critical care was time spent personally by me on the following activities: development of treatment plan with patient and/or surrogate as well as nursing, discussions with consultants, evaluation of patient's response to treatment, examination of patient, obtaining history from patient or surrogate, ordering and performing treatments and interventions, ordering and review of laboratory studies, ordering and review of radiographic studies, pulse oximetry and re-evaluation of patient's condition.  Results for orders  placed or performed during the hospital encounter of 09/06/16  Urinalysis, Routine w reflex microscopic  Result Value Ref Range   Color, Urine YELLOW YELLOW   APPearance HAZY (A) CLEAR   Specific Gravity, Urine 1.021 1.005 - 1.030   pH 6.0 5.0 - 8.0   Glucose, UA NEGATIVE NEGATIVE mg/dL   Hgb urine dipstick MODERATE (A) NEGATIVE   Bilirubin Urine NEGATIVE NEGATIVE   Ketones, ur NEGATIVE NEGATIVE mg/dL   Protein, ur NEGATIVE NEGATIVE mg/dL   Nitrite NEGATIVE NEGATIVE   Leukocytes, UA NEGATIVE NEGATIVE   RBC / HPF TOO NUMEROUS TO COUNT 0 - 5 RBC/hpf   WBC, UA 6-30 0 - 5 WBC/hpf   Bacteria, UA NONE SEEN NONE SEEN   Squamous Epithelial / LPF 0-5 (A) NONE SEEN   Mucous PRESENT    Ca Oxalate Crys, UA PRESENT   Comprehensive metabolic panel  Result Value Ref  Range   Sodium 145 135 - 145 mmol/L   Potassium 4.1 3.5 - 5.1 mmol/L   Chloride 104 101 - 111 mmol/L   CO2 31 22 - 32 mmol/L   Glucose, Bld 91 65 - 99 mg/dL   BUN 26 (H) 6 - 20 mg/dL   Creatinine, Ser 7.82 0.44 - 1.00 mg/dL   Calcium 9.6 8.9 - 95.6 mg/dL   Total Protein 8.3 (H) 6.5 - 8.1 g/dL   Albumin 3.3 (L) 3.5 - 5.0 g/dL   AST 17 15 - 41 U/L   ALT 12 (L) 14 - 54 U/L   Alkaline Phosphatase 82 38 - 126 U/L   Total Bilirubin 0.6 0.3 - 1.2 mg/dL   GFR calc non Af Amer 50 (L) >60 mL/min   GFR calc Af Amer 57 (L) >60 mL/min   Anion gap 10 5 - 15  Troponin I  Result Value Ref Range   Troponin I <0.03 <0.03 ng/mL  CBC with Differential  Result Value Ref Range   WBC 8.8 4.0 - 10.5 K/uL   RBC 5.10 3.87 - 5.11 MIL/uL   Hemoglobin 11.6 (L) 12.0 - 15.0 g/dL   HCT 21.3 08.6 - 57.8 %   MCV 76.5 (L) 78.0 - 100.0 fL   MCH 22.7 (L) 26.0 - 34.0 pg   MCHC 29.7 (L) 30.0 - 36.0 g/dL   RDW 46.9 (H) 62.9 - 52.8 %   Platelets 448 (H) 150 - 400 K/uL   Neutrophils Relative % 77 %   Neutro Abs 6.7 1.7 - 7.7 K/uL   Lymphocytes Relative 11 %   Lymphs Abs 1.0 0.7 - 4.0 K/uL   Monocytes Relative 7 %   Monocytes Absolute 0.6 0.1 - 1.0 K/uL   Eosinophils Relative 5 %   Eosinophils Absolute 0.4 0.0 - 0.7 K/uL   Basophils Relative 0 %   Basophils Absolute 0.0 0.0 - 0.1 K/uL  Blood gas, arterial  Result Value Ref Range   FIO2 0.21    pH, Arterial 7.490 (H) 7.350 - 7.450   pCO2 arterial 38.8 32.0 - 48.0 mmHg   pO2, Arterial 82.0 (L) 83.0 - 108.0 mmHg   Bicarbonate 29.7 (H) 20.0 - 28.0 mmol/L   Acid-Base Excess 5.8 (H) 0.0 - 2.0 mmol/L   O2 Saturation 96.3 %   Patient temperature 37.0    Collection site RIGHT RADIAL    Drawn by [id number]    Sample type ARTERIAL DRAW    Allens test (pass/fail) PASS PASS  I-Stat CG4 Lactic Acid, ED  Result Value Ref Range   Lactic Acid,  Venous 1.85 0.5 - 1.9 mmol/L   Dg Chest 1 View Result Date: 09/06/2016 CLINICAL DATA:  Altered mental status EXAM: CHEST 1 VIEW  COMPARISON:  None. FINDINGS: There is a small left pleural effusion. There is no right pleural effusion. There is chronic elevation of the right diaphragm. There is no focal consolidation. There is no pneumothorax. There is stable cardiomegaly. There is thoracic aortic atherosclerosis. The osseous structures are unremarkable. IMPRESSION: Small left pleural effusion. Electronically Signed   By: Elige Ko   On: 09/06/2016 11:03   Ct Head Wo Contrast Result Date: 09/06/2016 CLINICAL DATA:  Lethargy for several days.  No known injury. EXAM: CT HEAD WITHOUT CONTRAST TECHNIQUE: Contiguous axial images were obtained from the base of the skull through the vertex without intravenous contrast. COMPARISON:  Head CT scan 08/12/2016 and 05/03/2016. FINDINGS: Brain: Cortical atrophy and chronic microvascular ischemic change are identified. There is no evidence of acute intracranial abnormality including hemorrhage, infarct, mass lesion, mass effect, midline shift or abnormal extra-axial fluid collection. No hydrocephalus or pneumocephalus. Vascular: Atherosclerosis is noted. Skull: Intact. Sinuses/Orbits: Status post bilateral lens extraction. No acute abnormality. Other: Large scalp calcification on the left is unchanged and likely due to a chronic sebaceous cyst. IMPRESSION: No acute abnormality. Atrophy and chronic microvascular ischemic change. Atherosclerosis. Electronically Signed   By: Drusilla Kanner M.D.   On: 09/06/2016 11:18   Ct Angio Chest Pe W/cm &/or Wo Cm Result Date: 09/06/2016 CLINICAL DATA:  Lethargy and cyanosis. EXAM: CT ANGIOGRAPHY CHEST WITH CONTRAST TECHNIQUE: Multidetector CT imaging of the chest was performed using the standard protocol during bolus administration of intravenous contrast. Multiplanar CT image reconstructions and MIPs were obtained to evaluate the vascular anatomy. CONTRAST:  75 cc of Isovue 370 COMPARISON:  09/06/2016 FINDINGS: Cardiovascular: Moderate cardiac enlargement.  Aortic atherosclerosis noted. The right and left central pulmonary arteries appear prominent. There are age indeterminate filling defects involving the right upper lobe right lower lobe, left upper lobe and left lower lobe lobar pulmonary arteries. Some of these filling defects extend along the wall of the pulmonary arteries and others have a web like appearance suggesting a chronic component.Other filling defects are completely occluding, for example anterior left upper lobe, image 72 of series 5. Mediastinum/Nodes: The trachea appears patent and is midline. Normal appearance of the esophagus. No mediastinal or hilar adenopathy. Lungs/Pleura: Bilateral posterior pleural thickening identified. There is partial atelectasis of the right middle lobe. Calcified nodule within the lingula is again identified compatible with a benign process. Upper Abdomen: Right lobe of liver cyst measures 1 cm. No acute abnormality identified. Musculoskeletal: There is a scoliosis deformity which appears convex towards the right. Multi level degenerative disc disease identified within the lower thoracic spine. There is a marked pectus deformity involving the ventral chest wall. Review of the MIP images confirms the above findings. IMPRESSION: 1. Examination is positive for acute on chronic bilateral upper and lower lobe pulmonary emboli. 2. Evidence of pulmonary hypertension which may be the sequelae of chronic pulmonary emboli. 3.  Aortic Atherosclerosis (ICD10-I70.0). 4. Progressive partial atelectasis of the right middle lobe. Etiology indeterminate. 5. Partially calcified nodule in the lingula lobe is unchanged compatible with a benign process. 6. Pectus deformity. 7. Critical Value/emergent results were called by telephone at the time of interpretation on 09/06/2016 at 12:14 pm to Dr. Samuel Jester , who verbally acknowledged these results. Electronically Signed   By: Signa Kell M.D.   On: 09/06/2016 12:14    1300:  IV  heparin started. VS have remain stable. O2 Sat with one time drop from 100% R/A to 91% R/A; O2 2L N/C applied. Dx and testing d/w pt and family.  Questions answered.  Verb understanding, agreeable to admit. T/C to [facility] Dr. Ardyth Harps, case discussed, including:  HPI, pertinent PM/SHx, VS/PE, dx testing, ED course and treatment:  Agreeable to admit.  Final Clinical Impressions(s) / ED Diagnoses   Final diagnoses:  None    New Prescriptions New Prescriptions   No medications on file      Samuel Jester, DO 09/09/16 1636

## 2016-09-06 NOTE — ED Notes (Signed)
Dr Ardyth Harps notified of critical PTT. No new orders.

## 2016-09-06 NOTE — Progress Notes (Signed)
ANTICOAGULATION CONSULT NOTE - Initial Consult  Pharmacy Consult for HEPARIN Indication: pulmonary embolus  Allergies  Allergen Reactions  . Keflex [Cephalexin]   . Penicillins     Unknown. Has patient had a PCN reaction causing immediate rash, facial/tongue/throat swelling, SOB or lightheadedness with hypotension: unknown Has patient had a PCN reaction causing severe rash involving mucus membranes or skin necrosis: unknown Has patient had a PCN reaction that required hospitalization unknown Has patient had a PCN reaction occurring within the last 10 years: unknown If all of the above answers are "NO", then may proceed with Cephalosporin use.    Patient Measurements: Weight: 137 lb (62.1 kg)  Vital Signs: Temp: 97.7 F (36.5 C) (04/19 0951) Temp Source: Oral (04/19 0951) BP: 145/102 (04/19 1045) Pulse Rate: 69 (04/19 1045)  Labs:  Recent Labs  09/06/16 1015  HGB 11.6*  HCT 39.0  PLT 448*  CREATININE 1.00  TROPONINI <0.03   Estimated Creatinine Clearance: 39.3 mL/min (by C-G formula based on SCr of 1 mg/dL).  Medical History: Past Medical History:  Diagnosis Date  . Arthritis   . Confusion   . Dementia   . GERD (gastroesophageal reflux disease)   . Glaucoma   . Hypertension   . Intractable hiccups? 09/16/2013  . Thyroid disease    Medications:   (Not in a hospital admission)  Assessment: 83yo female.  Pt brought in by EMS from [facility] due to several days of lethargy and cyanosis of fingernails. Pt is alert and verbally responsive. Oriented to person only. Per ems pt is normally w/c bound. Noted to have cyanosis of fingernails. Hands cool to touch. Asked to initiate Heparin for PE.  Goal of Therapy:  Heparin level 0.3-0.7 units/ml Monitor platelets by anticoagulation protocol: Yes   Plan:   Heparin 5000 units IV now x 1  Heparin infusion at 1000 units/hr (~ 16 units/Kg/Hr)  Heparin level in 6-8 hrs then daily  CBC daily while on  Heparin  Margo Aye, Abir Craine A 09/06/2016,12:11 PM

## 2016-09-06 NOTE — ED Triage Notes (Signed)
Pt brought in by EMS from [facility] due to several days of lethargy and cyanosis of fingernails. Pt is alert and verbally responsive. Oriented to person only. Per ems pt is normally w/c bound. Noted to have cyanosis of fingernails. Hands cool to touch. Able to follow some commands

## 2016-09-06 NOTE — H&P (Signed)
History and Physical    Ruth Case:[id number] DOB: 10-14-1927 DOA: 09/06/2016  Referring MD/NP/PA: Samuel Jester, EDP PCP: Galvin Proffer, MD  Patient coming from: [facility]  Chief Complaint: fatigue, lethargy, blue lips and fingers  HPI: Ruth Case is a 83 y.o. female with h/o late stage Alzheimer's dementia, GERD, hypothyroidism, HTN, glaucoma who is sent in from her ALF due to above complaints. In the ED, after an extensive work up she was found to have acute on chronic bilateral PEs on CT angio of the chest, started on heparin and referred for admission. DIL at bedside, reports that she has had 3 recent falls at ALF, 2 of them involved minor head injuries. At the ALF they were in the process of involving hospice prior to this acute event. She agrees to DNR.  Past Medical/Surgical History: Past Medical History:  Diagnosis Date  . Arthritis   . Confusion   . Dementia   . GERD (gastroesophageal reflux disease)   . Glaucoma   . Hypertension   . Intractable hiccups? 09/16/2013  . Thyroid disease     History reviewed. No pertinent surgical history.  Social History:  reports that she has never smoked. She has never used smokeless tobacco. She reports that she does not drink alcohol or use drugs.  Allergies: Allergies  Allergen Reactions  . Keflex [Cephalexin]   . Penicillins     Unknown. Has patient had a PCN reaction causing immediate rash, facial/tongue/throat swelling, SOB or lightheadedness with hypotension: unknown Has patient had a PCN reaction causing severe rash involving mucus membranes or skin necrosis: unknown Has patient had a PCN reaction that required hospitalization unknown Has patient had a PCN reaction occurring within the last 10 years: unknown If all of the above answers are "NO", then may proceed with Cephalosporin use.     Family History:  Family History  Problem Relation Age of Onset  . COPD Sister   . Stroke  Brother   . Hypertension Brother     Prior to Admission medications   Medication Sig Start Date End Date Taking? Authorizing Provider  acetaminophen (TYLENOL) 500 MG tablet Take 1,000 mg by mouth every 6 (six) hours as needed for mild pain or headache.   Yes Historical Provider, MD  Calcium Carbonate-Vitamin D3 (CALCIUM 600/VITAMIN D) 600-400 MG-UNIT TABS Take 2 tablets by mouth daily. 08/01/15  Yes Junie Spencer, FNP  donepezil (ARICEPT) 23 MG TABS tablet Take 1 tablet (23 mg total) by mouth at bedtime. 12/06/15  Yes Mary-Margaret Daphine Deutscher, FNP  guaifenesin (ROBITUSSIN) 100 MG/5ML syrup Take 200 mg by mouth every 6 (six) hours as needed for cough.   Yes Historical Provider, MD  latanoprost (XALATAN) 0.005 % ophthalmic solution Place 1 drop into both eyes at bedtime.   Yes Historical Provider, MD  magnesium hydroxide (MILK OF MAGNESIA) 400 MG/5ML suspension Take 30 mLs by mouth 2 (two) times daily as needed for mild constipation.   Yes Historical Provider, MD  mirtazapine (REMERON) 30 MG tablet Take 1 tablet (30 mg total) by mouth at bedtime. 12/06/15  Yes Mary-Margaret Daphine Deutscher, FNP  naproxen (NAPROSYN) 500 MG tablet Take BID for 1 week, then as BID prn for pain Patient taking differently: Take 500 mg by mouth 2 (two) times daily as needed for moderate pain. Take BID for 1 week, then as BID prn for pain 08/01/15  Yes Junie Spencer, FNP  polyethylene glycol powder (GLYCOLAX/MIRALAX) powder Take 17 g by  mouth daily.    Yes Historical Provider, MD  solifenacin (VESICARE) 10 MG tablet Take 1 tablet (10 mg total) by mouth daily. 12/06/15  Yes Mary-Margaret Daphine Deutscher, FNP  valsartan (DIOVAN) 40 MG tablet Take 20 mg by mouth daily. Hold if SBP<110.   Yes Historical Provider, MD  levothyroxine (SYNTHROID, LEVOTHROID) 75 MCG tablet Take 1 tablet (75 mcg total) by mouth daily. Patient not taking: Reported on 09/06/2016 12/06/15   Mary-Margaret Daphine Deutscher, FNP  valsartan (DIOVAN) 80 MG tablet Take 1 tablet (80 mg total)  by mouth daily. Patient not taking: Reported on 09/06/2016 06/07/15   Mary-Margaret Daphine Deutscher, FNP    Review of Systems:  Unable to obtain given current mental state   Physical Exam: Vitals:   09/06/16 1400 09/06/16 1442 09/06/16 1514 09/06/16 1536  BP: (!) 102/59 (!) 142/76 137/70   Pulse: 67 69 71   Resp: Temp:   98.7 F (37.1 C)   TempSrc:   Axillary   SpO2: 99% 100% 100%   Weight:   61.9 kg (136 lb 7.4 oz) 62 kg (136 lb 11 oz)  Height:     (1.702 m)     Constitutional: NAD, calm, comfortable Eyes: PERRL, lids and conjunctivae normal ENMT: Mucous membranes are moist. Posterior pharynx clear of any exudate or lesions.Normal dentition.  Neck: normal, supple, no masses, no thyromegaly Respiratory: clear to auscultation bilaterally, no wheezing, no crackles. Normal respiratory effort. No accessory muscle use.  Cardiovascular: Regular rate and rhythm, no murmurs / rubs / gallops. No extremity edema. 2+ pedal pulses. No carotid bruits.  Abdomen: no tenderness, no masses palpated. No hepatosplenomegaly. Bowel sounds positive.  Musculoskeletal: no clubbing / cyanosis. No joint deformity upper and lower extremities. Good ROM, no contractures. Normal muscle tone.  Skin: no rashes, lesions, ulcers. No induration Neurologic: CN 2-12 grossly intact. Sensation intact, DTR normal. Strength 5/5 in all 4.  Psychiatric: Normal judgment and insight. Alert and oriented x 3. Normal mood.    Labs on Admission: I have personally reviewed the following labs and imaging studies  CBC:  Recent Labs Lab 09/06/16 1015  WBC 8.8  NEUTROABS 6.7  HGB 11.6*  HCT 39.0  MCV 76.5*  PLT 448*   Basic Metabolic Panel:  Recent Labs Lab 09/06/16 1015  NA 145  K 4.1  CL 104  CO2 31  GLUCOSE 91  BUN 26*  CREATININE 1.00  CALCIUM 9.6   GFR: Estimated Creatinine Clearance: 39.3 mL/min (by C-G formula based on SCr of 1 mg/dL). Liver Function Tests:  Recent Labs Lab 09/06/16 1015    AST 17  ALT 12*  ALKPHOS 82  BILITOT 0.6  PROT 8.3*  ALBUMIN 3.3*   No results for input(s): LIPASE, AMYLASE in the last 168 hours. No results for input(s): AMMONIA in the last 168 hours. Coagulation Profile: No results for input(s): INR, PROTIME in the last 168 hours. Cardiac Enzymes:  Recent Labs Lab 09/06/16 1015  TROPONINI <0.03   BNP (last 3 results) No results for input(s): PROBNP in the last 8760 hours. HbA1C: No results for input(s): HGBA1C in the last 72 hours. CBG: No results for input(s): GLUCAP in the last 168 hours. Lipid Profile: No results for input(s): CHOL, HDL, LDLCALC, TRIG, CHOLHDL, LDLDIRECT in the last 72 hours. Thyroid Function Tests: No results for input(s): TSH, T4TOTAL, FREET4, T3FREE, THYROIDAB in the last 72 hours. Anemia Panel: No results for input(s): VITAMINB12, FOLATE, FERRITIN, TIBC, IRON, RETICCTPCT in the last 72  hours. Urine analysis:    Component Value Date/Time   COLORURINE YELLOW 09/06/2016 1013   APPEARANCEUR HAZY (A) 09/06/2016 1013   LABSPEC 1.021 09/06/2016 1013   PHURINE 6.0 09/06/2016 1013   GLUCOSEU NEGATIVE 09/06/2016 1013   HGBUR MODERATE (A) 09/06/2016 1013   BILIRUBINUR NEGATIVE 09/06/2016 1013   BILIRUBINUR neg 12/01/2014 1618   KETONESUR NEGATIVE 09/06/2016 1013   PROTEINUR NEGATIVE 09/06/2016 1013   UROBILINOGEN negative 12/01/2014 1618   NITRITE NEGATIVE 09/06/2016 1013   LEUKOCYTESUR NEGATIVE 09/06/2016 1013   Sepsis Labs: (procalcitonin:4,lacticidven:4) )No results found for this or any previous visit (from the past 240 hour(s)).   Radiological Exams on Admission: Dg Chest 1 View  Result Date: 09/06/2016 CLINICAL DATA:  Altered mental status EXAM: CHEST 1 VIEW COMPARISON:  None. FINDINGS: There is a small left pleural effusion. There is no right pleural effusion. There is chronic elevation of the right diaphragm. There is no focal consolidation. There is no pneumothorax. There is stable  cardiomegaly. There is thoracic aortic atherosclerosis. The osseous structures are unremarkable. IMPRESSION: Small left pleural effusion. Electronically Signed   By: Elige Ko   On: 09/06/2016 11:03   Ct Head Wo Contrast  Result Date: 09/06/2016 CLINICAL DATA:  Lethargy for several days.  No known injury. EXAM: CT HEAD WITHOUT CONTRAST TECHNIQUE: Contiguous axial images were obtained from the base of the skull through the vertex without intravenous contrast. COMPARISON:  Head CT scan 08/12/2016 and 05/03/2016. FINDINGS: Brain: Cortical atrophy and chronic microvascular ischemic change are identified. There is no evidence of acute intracranial abnormality including hemorrhage, infarct, mass lesion, mass effect, midline shift or abnormal extra-axial fluid collection. No hydrocephalus or pneumocephalus. Vascular: Atherosclerosis is noted. Skull: Intact. Sinuses/Orbits: Status post bilateral lens extraction. No acute abnormality. Other: Large scalp calcification on the left is unchanged and likely due to a chronic sebaceous cyst. IMPRESSION: No acute abnormality. Atrophy and chronic microvascular ischemic change. Atherosclerosis. Electronically Signed   By: Drusilla Kanner M.D.   On: 09/06/2016 11:18   Ct Angio Chest Pe W/cm &/or Wo Cm  Result Date: 09/06/2016 CLINICAL DATA:  Lethargy and cyanosis. EXAM: CT ANGIOGRAPHY CHEST WITH CONTRAST TECHNIQUE: Multidetector CT imaging of the chest was performed using the standard protocol during bolus administration of intravenous contrast. Multiplanar CT image reconstructions and MIPs were obtained to evaluate the vascular anatomy. CONTRAST:  75 cc of Isovue 370 COMPARISON:  09/06/2016 FINDINGS: Cardiovascular: Moderate cardiac enlargement. Aortic atherosclerosis noted. The right and left central pulmonary arteries appear prominent. There are age indeterminate filling defects involving the right upper lobe right lower lobe, left upper lobe and left lower lobe lobar  pulmonary arteries. Some of these filling defects extend along the wall of the pulmonary arteries and others have a web like appearance suggesting a chronic component.Other filling defects are completely occluding, for example anterior left upper lobe, image 72 of series 5. Mediastinum/Nodes: The trachea appears patent and is midline. Normal appearance of the esophagus. No mediastinal or hilar adenopathy. Lungs/Pleura: Bilateral posterior pleural thickening identified. There is partial atelectasis of the right middle lobe. Calcified nodule within the lingula is again identified compatible with a benign process. Upper Abdomen: Right lobe of liver cyst measures 1 cm. No acute abnormality identified. Musculoskeletal: There is a scoliosis deformity which appears convex towards the right. Multi level degenerative disc disease identified within the lower thoracic spine. There is a marked pectus deformity involving the ventral chest wall. Review of the MIP images confirms the above findings. IMPRESSION: 1.  Examination is positive for acute on chronic bilateral upper and lower lobe pulmonary emboli. 2. Evidence of pulmonary hypertension which may be the sequelae of chronic pulmonary emboli. 3.  Aortic Atherosclerosis (ICD10-I70.0). 4. Progressive partial atelectasis of the right middle lobe. Etiology indeterminate. 5. Partially calcified nodule in the lingula lobe is unchanged compatible with a benign process. 6. Pectus deformity. 7. Critical Value/emergent results were called by telephone at the time of interpretation on 09/06/2016 at 12:14 pm to Dr. Samuel Jester , who verbally acknowledged these results. Electronically Signed   By: Signa Kell M.D.   On: 09/06/2016 12:14    EKG: Independently reviewed. NSR, no acute ischemic abnormalities  Assessment/Plan Principal Problem:   Pulmonary embolism (HCC) Active Problems:   Hypothyroidism   Essential hypertension, benign   Alzheimer's disease    Acute on  Chronic PEs -After prolonged discussion with DIL, decision has been to make her a DNR and stop anticoagulation given her late-stage dementia and h/o multiple recent falls. -She would like to discuss options with palliative care, which I think is very appropriate in her current situation. -Will consult palliative care to determine final disposition plans. Family would like her to return to [place] [facility] with hospice services if possible.  Alzheimer's Dementia -Seems advanced but at baseline.  Hypothyroidism -Not on meds.  HTN -Well controlled. -Continue home meds.   DVT prophylaxis: None  Code Status: DNR  Family Communication: DIL at bedside updated on plan of care and all questions answered  Disposition Plan: anticipate DC back to ALF in am vs hospice home  Consults called: palliative care  Admission status: observation    Time Spent: 80 minutes  Chaya Jan MD [facility] Pager [phone]  If 7PM-7AM, please contact night-coverage [email or web address] Password [facility]  09/06/2016, 4:32 PM

## 2016-09-06 NOTE — ED Notes (Signed)
sats on room air 88-91 %. O2 @ 2 L/min initiated

## 2016-09-07 ENCOUNTER — Encounter (HOSPITAL_COMMUNITY): Payer: Self-pay | Admitting: Primary Care

## 2016-09-07 DIAGNOSIS — L8990 Pressure ulcer of unspecified site, unspecified stage: Secondary | ICD-10-CM | POA: Insufficient documentation

## 2016-09-07 DIAGNOSIS — I2602 Saddle embolus of pulmonary artery with acute cor pulmonale: Secondary | ICD-10-CM

## 2016-09-07 DIAGNOSIS — Z515 Encounter for palliative care: Secondary | ICD-10-CM

## 2016-09-07 DIAGNOSIS — G301 Alzheimer's disease with late onset: Secondary | ICD-10-CM | POA: Diagnosis not present

## 2016-09-07 DIAGNOSIS — Z7189 Other specified counseling: Secondary | ICD-10-CM

## 2016-09-07 NOTE — Progress Notes (Signed)
IV discontinued,catheter intact. Patient discharged to [facility], report was called and given to [place] the caretaker at the facility. [facility] to transport patient to awaiting Facility.

## 2016-09-07 NOTE — Discharge Summary (Signed)
Physician Discharge Summary  Ruth Case:[id number] DOB: 09/22/1927 DOA: 09/06/2016  PCP: Galvin Proffer, MD  Admit date: 09/06/2016 Discharge date: 09/07/2016  Time spent: 45 minutes  Recommendations for Outpatient Follow-up:  -Will be discharged back to ALF today with hospice services.   Discharge Diagnoses:  Principal Problem:   Pulmonary embolism (HCC) Active Problems:   Hypothyroidism   Essential hypertension, benign   Alzheimer's disease   Pressure injury of skin   Discharge Condition: Stable, guarded  Filed Weights   09/06/16 0944 09/06/16 1514 09/06/16 1536  Weight: 62.1 kg (137 lb) 61.9 kg (136 lb 7.4 oz) 62 kg (136 lb 11 oz)    History of present illness:  Ruth Case is a 83 y.o. female with h/o late stage Alzheimer's dementia, GERD, hypothyroidism, HTN, glaucoma who is sent in from her ALF due to above complaints. In the ED, after an extensive work up she was found to have acute on chronic bilateral PEs on CT angio of the chest, started on heparin and referred for admission. DIL at bedside, reports that she has had 3 recent falls at ALF, 2 of them involved minor head injuries. At the ALF they were in the process of involving hospice prior to this acute event. She agrees to DNR.   Hospital Course:   Acute on Chronic PEs -After prolonged discussion with family members, decision has been made NOT to anticoagulate her given her advanced dementia and multiple recent falls. -She is now a DNR and family would like to receive hospice services at ALF. -Continue oxygen as needed for comfort.  Alzheimer's Dementia -Severe and advanced. -unclear benefit of aricept at this stage; consider discontinuation.  Hypothyroidism -Not on meds; have not checked TSH given hospice status.  HTN -Well controlled. -Continue home meds  Procedures:  None   Consultations:  Palliative care  Discharge Instructions   Allergies as of 09/07/2016      Reactions     Keflex [cephalexin]    Penicillins    Unknown. Has patient had a PCN reaction causing immediate rash, facial/tongue/throat swelling, SOB or lightheadedness with hypotension: unknown Has patient had a PCN reaction causing severe rash involving mucus membranes or skin necrosis: unknown Has patient had a PCN reaction that required hospitalization unknown Has patient had a PCN reaction occurring within the last 10 years: unknown If all of the above answers are "NO", then may proceed with Cephalosporin use.      Medication List    STOP taking these medications   levothyroxine 75 MCG tablet Commonly known as:  SYNTHROID, LEVOTHROID   naproxen 500 MG tablet Commonly known as:  NAPROSYN     TAKE these medications   acetaminophen 500 MG tablet Commonly known as:  TYLENOL Take 1,000 mg by mouth every 6 (six) hours as needed for mild pain or headache.   Calcium Carbonate-Vitamin D3 600-400 MG-UNIT Tabs Commonly known as:  CALCIUM 600/VITAMIN D Take 2 tablets by mouth daily.   donepezil 23 MG Tabs tablet Commonly known as:  ARICEPT Take 1 tablet (23 mg total) by mouth at bedtime.   guaifenesin 100 MG/5ML syrup Commonly known as:  ROBITUSSIN Take 200 mg by mouth every 6 (six) hours as needed for cough.   latanoprost 0.005 % ophthalmic solution Commonly known as:  XALATAN Place 1 drop into both eyes at bedtime.   magnesium hydroxide 400 MG/5ML suspension Commonly known as:  MILK OF MAGNESIA Take 30 mLs by mouth 2 (two) times daily  as needed for mild constipation.   mirtazapine 30 MG tablet Commonly known as:  REMERON Take 1 tablet (30 mg total) by mouth at bedtime.   polyethylene glycol powder powder Commonly known as:  GLYCOLAX/MIRALAX Take 17 g by mouth daily.   solifenacin 10 MG tablet Commonly known as:  VESICARE Take 1 tablet (10 mg total) by mouth daily.   valsartan 40 MG tablet Commonly known as:  DIOVAN Take 20 mg by mouth daily. Hold if SBP<110. What changed:   Another medication with the same name was removed. Continue taking this medication, and follow the directions you see here.      Allergies  Allergen Reactions  . Keflex [Cephalexin]   . Penicillins     Unknown. Has patient had a PCN reaction causing immediate rash, facial/tongue/throat swelling, SOB or lightheadedness with hypotension: unknown Has patient had a PCN reaction causing severe rash involving mucus membranes or skin necrosis: unknown Has patient had a PCN reaction that required hospitalization unknown Has patient had a PCN reaction occurring within the last 10 years: unknown If all of the above answers are "NO", then may proceed with Cephalosporin use.    Follow-up Information    HAGUE, Myrene Galas, MD. Schedule an appointment as soon as possible for a visit in 2 week(s).   Specialty:  Internal Medicine Contact information: [address] [place] [address] [phone]            The results of significant diagnostics from this hospitalization (including imaging, microbiology, ancillary and laboratory) are listed below for reference.    Significant Diagnostic Studies: Dg Chest 1 View  Result Date: 09/06/2016 CLINICAL DATA:  Altered mental status EXAM: CHEST 1 VIEW COMPARISON:  None. FINDINGS: There is a small left pleural effusion. There is no right pleural effusion. There is chronic elevation of the right diaphragm. There is no focal consolidation. There is no pneumothorax. There is stable cardiomegaly. There is thoracic aortic atherosclerosis. The osseous structures are unremarkable. IMPRESSION: Small left pleural effusion. Electronically Signed   By: Elige Ko   On: 09/06/2016 11:03   Ct Head Wo Contrast  Result Date: 09/06/2016 CLINICAL DATA:  Lethargy for several days.  No known injury. EXAM: CT HEAD WITHOUT CONTRAST TECHNIQUE: Contiguous axial images were obtained from the base of the skull through the vertex without intravenous contrast. COMPARISON:   Head CT scan 08/12/2016 and 05/03/2016. FINDINGS: Brain: Cortical atrophy and chronic microvascular ischemic change are identified. There is no evidence of acute intracranial abnormality including hemorrhage, infarct, mass lesion, mass effect, midline shift or abnormal extra-axial fluid collection. No hydrocephalus or pneumocephalus. Vascular: Atherosclerosis is noted. Skull: Intact. Sinuses/Orbits: Status post bilateral lens extraction. No acute abnormality. Other: Large scalp calcification on the left is unchanged and likely due to a chronic sebaceous cyst. IMPRESSION: No acute abnormality. Atrophy and chronic microvascular ischemic change. Atherosclerosis. Electronically Signed   By: Drusilla Kanner M.D.   On: 09/06/2016 11:18   Ct Head Wo Contrast  Result Date: 08/12/2016 CLINICAL DATA:  83 year old female status post fall out of wheelchair. Laceration on the bridge of the nose. No loss of consciousness. Initial encounter. EXAM: CT HEAD WITHOUT CONTRAST TECHNIQUE: Contiguous axial images were obtained from the base of the skull through the vertex without intravenous contrast. COMPARISON:  Head face and cervical spine CT 07/05/2016 and earlier. FINDINGS: Brain: Cerebral volume is stable since 2017. Stable gray-white matter differentiation throughout the brain, patchy bilateral white matter hypodensity. Prominent left inferior lentiform region perivascular  space. 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 No midline shift, ventriculomegaly, mass effect, evidence of mass lesion, intracranial hemorrhage or evidence of cortically based acute infarction. Vascular: Calcified atherosclerosis at the skull base. No suspicious intracranial vascular hyperdensity. Skull: Calvarium appears stable and intact. No acute nasal bone fracture identified. Visible facial bones appear stable and intact. No acute osseous abnormality identified. Sinuses/Orbits: Visualized paranasal sinuses and mastoids are  stable and well pneumatized. Other: Broad-based right paracentral forehead scalp hematoma up to 7 mm in thickness. Underlying frontal bones are intact. No other acute scalp soft tissue finding; chronic left occipital region 2.7 cm scalp/dermal cyst with speckled calcifications remain stable. Orbits soft tissues are stable and within normal limits. Negative visualized noncontrast deep soft tissue spaces of the face. IMPRESSION: 1. Forehead scalp hematoma without underlying fracture. 2. Stable brain.  No acute intracranial abnormality. Electronically Signed   By: Odessa Fleming M.D.   On: 08/12/2016 14:48   Ct Angio Chest Pe W/cm &/or Wo Cm  Result Date: 09/06/2016 CLINICAL DATA:  Lethargy and cyanosis. EXAM: CT ANGIOGRAPHY CHEST WITH CONTRAST TECHNIQUE: Multidetector CT imaging of the chest was performed using the standard protocol during bolus administration of intravenous contrast. Multiplanar CT image reconstructions and MIPs were obtained to evaluate the vascular anatomy. CONTRAST:  75 cc of Isovue 370 COMPARISON:  09/06/2016 FINDINGS: Cardiovascular: Moderate cardiac enlargement. Aortic atherosclerosis noted. The right and left central pulmonary arteries appear prominent. There are age indeterminate filling defects involving the right upper lobe right lower lobe, left upper lobe and left lower lobe lobar pulmonary arteries. Some of these filling defects extend along the wall of the pulmonary arteries and others have a web like appearance suggesting a chronic component.Other filling defects are completely occluding, for example anterior left upper lobe, image 72 of series 5. Mediastinum/Nodes: The trachea appears patent and is midline. Normal appearance of the esophagus. No mediastinal or hilar adenopathy. Lungs/Pleura: Bilateral posterior pleural thickening identified. There is partial atelectasis of the right middle lobe. Calcified nodule within the lingula is again identified compatible with a benign process.  Upper Abdomen: Right lobe of liver cyst measures 1 cm. No acute abnormality identified. Musculoskeletal: There is a scoliosis deformity which appears convex towards the right. Multi level degenerative disc disease identified within the lower thoracic spine. There is a marked pectus deformity involving the ventral chest wall. Review of the MIP images confirms the above findings. IMPRESSION: 1. Examination is positive for acute on chronic bilateral upper and lower lobe pulmonary emboli. 2. Evidence of pulmonary hypertension which may be the sequelae of chronic pulmonary emboli. 3.  Aortic Atherosclerosis (ICD10-I70.0). 4. Progressive partial atelectasis of the right middle lobe. Etiology indeterminate. 5. Partially calcified nodule in the lingula lobe is unchanged compatible with a benign process. 6. Pectus deformity. 7. Critical Value/emergent results were called by telephone at the time of interpretation on 09/06/2016 at 12:14 pm to Dr. Samuel Jester , who verbally acknowledged these results. Electronically Signed   By: Signa Kell M.D.   On: 09/06/2016 12:14    Microbiology: Recent Results (from the past 240 hour(s))  MRSA PCR Screening     Status: Abnormal   Collection Time: 09/06/16  5:15 PM  Result Value Ref Range Status   MRSA by PCR INVALID RESULTS, SPECIMEN SENT FOR CULTURE (A) NEGATIVE Final    Comment: RESULT CALLED TO, READ BACK BY AND VERIFIED WITH: BIVENS,L @ 1854 ON 4.19.18 BY BAYSE,L  The GeneXpert MRSA Assay (FDA approved for NASAL specimens only), is one component of a comprehensive MRSA colonization surveillance program. It is not intended to diagnose MRSA infection nor to guide or monitor treatment for MRSA infections.   MRSA culture     Status: None (Preliminary result)   Collection Time: 09/06/16  5:30 PM  Result Value Ref Range Status   Specimen Description NASAL SWAB  Final   Special Requests NONE  Final   Culture   Final    NO GROWTH,  REINCUBATED Performed at [facility], [address], [place], [place] [address]    Report Status PENDING  Incomplete     Labs: Basic Metabolic Panel:  Recent Labs Lab 09/06/16 1015  NA 145  K 4.1  CL 104  CO2 31  GLUCOSE 91  BUN 26*  CREATININE 1.00  CALCIUM 9.6   Liver Function Tests:  Recent Labs Lab 09/06/16 1015  AST 17  ALT 12*  ALKPHOS 82  BILITOT 0.6  PROT 8.3*  ALBUMIN 3.3*   No results for input(s): LIPASE, AMYLASE in the last 168 hours. No results for input(s): AMMONIA in the last 168 hours. CBC:  Recent Labs Lab 09/06/16 1015  WBC 8.8  NEUTROABS 6.7  HGB 11.6*  HCT 39.0  MCV 76.5*  PLT 448*   Cardiac Enzymes:  Recent Labs Lab 09/06/16 1015  TROPONINI <0.03   BNP: BNP (last 3 results) No results for input(s): BNP in the last 8760 hours.  ProBNP (last 3 results) No results for input(s): PROBNP in the last 8760 hours.  CBG: No results for input(s): GLUCAP in the last 168 hours.     SignedChaya Jan  [facility] Pager: [phone] 09/07/2016, 1:23 PM

## 2016-09-07 NOTE — Plan of Care (Signed)
Problem: Education: Goal: Knowledge of [facility] Education information/materials will improve Outcome: Progressing Education given to family, patient has dementia

## 2016-09-07 NOTE — Consult Note (Signed)
Consultation Note Date: 09/07/2016   Patient Name: Ruth Case  DOB: Aug 02, 1927  MRN: [id number]  Age / Sex: 83 y.o., female  PCP: Shelbie Ammons, MD Referring Physician: Micael Hampshire Acost*  Reason for Consultation: Establishing goals of care, Hospice Evaluation and Psychosocial/spiritual support  HPI/Patient Profile: 83 y.o. female  with past medical history of arthritis, dementia, HTN, thryoid disease, falls admitted on 09/06/2016 with acute on chronic PE.   Clinical Assessment and Goals of Care: Ruth Case is resting quietly in bed, she makes and keeps eye contact, but is mostly non verbal. DIL Gracy Racer, main caregiver, is also at bedside.  We talk about Ruth Case's pathway over the last few years.  Family had been considering hospice or [facility], and they are agreeable to return to [facility] if possible with Hospice. We talk about possible sudden declines and that Ruth Case will be able to transfer to Hospice home if needed.   We discuss support from [employer] and [employer]. I also share [employer]line[employer] for trusted health information.   Health care Power of atty [place] - sons, Ethelene Browns and Clide Cliff who share POA.    SUMMARY OF RECOMMENDATIONS   No blood thinner, return to [facility] with [facility].    Code Status/Advance Care Planning:  DNR  Symptom Management:   Per hospitalist, no additional needs.   Palliative Prophylaxis:   Aspiration, Oral Care and Turn Reposition  Additional Recommendations (Limitations, Scope, Preferences):  NO blood thinner, no CPR or intubation.   Psycho-social/Spiritual:   Desire for further Chaplaincy support:no  Additional Recommendations: Caregiving  Support/Resources and Education on Hospice  Prognosis:   < 3 months, or less would not be surprising based on current PE, hx of falls, advanced dementia.    Discharge Planning: returbn to [facility] with [facility].       Primary Diagnoses: Present on Admission: . Pulmonary embolism (HCC) . Alzheimer's disease . Hypothyroidism . Essential hypertension, benign   I have reviewed the medical record, interviewed the patient and family, and examined the patient. The following aspects are pertinent.  Past Medical History:  Diagnosis Date  . Arthritis   . Confusion   . Dementia   . GERD (gastroesophageal reflux disease)   . Glaucoma   . Hypertension   . Intractable hiccups? 09/16/2013  . Thyroid disease    Social History   Social History  . Marital status: Married    Spouse name: N/A  . Number of children: 2  . Years of education: N/A   Occupational History  . retired    Social History Main Topics  . Smoking status: Never Smoker  . Smokeless tobacco: Never Used  . Alcohol use No  . Drug use: No  . Sexual activity: No   Other Topics Concern  . None   Social History Narrative  . None   Family History  Problem Relation Age of Onset  . COPD Sister   . Stroke  Brother   . Hypertension Brother    Scheduled Meds: . calcium-vitamin D  2 tablet Oral Daily  . darifenacin  7.5 mg Oral Daily  . donepezil  23 mg Oral QHS  . irbesartan  75 mg Oral Daily  . latanoprost  1 drop Both Eyes QHS  . mirtazapine  30 mg Oral QHS  . polyethylene glycol  17 g Oral Daily  . sodium chloride flush  3 mL Intravenous Q12H   Continuous Infusions: . sodium chloride     PRN Meds:.sodium chloride, acetaminophen **OR** acetaminophen, ketorolac, ondansetron **OR** ondansetron (ZOFRAN) IV, senna-docusate, sodium chloride flush Medications Prior to Admission:  Prior to Admission medications   Medication Sig Start Date End Date Taking? Authorizing Provider  acetaminophen (TYLENOL) 500 MG tablet Take 1,000 mg by mouth every 6 (six) hours as needed for mild pain or headache.   Yes Historical Provider, MD  Calcium  Carbonate-Vitamin D3 (CALCIUM 600/VITAMIN D) 600-400 MG-UNIT TABS Take 2 tablets by mouth daily. 08/01/15  Yes Junie Spencer, FNP  donepezil (ARICEPT) 23 MG TABS tablet Take 1 tablet (23 mg total) by mouth at bedtime. 12/06/15  Yes Mary-Margaret Daphine Deutscher, FNP  guaifenesin (ROBITUSSIN) 100 MG/5ML syrup Take 200 mg by mouth every 6 (six) hours as needed for cough.   Yes Historical Provider, MD  latanoprost (XALATAN) 0.005 % ophthalmic solution Place 1 drop into both eyes at bedtime.   Yes Historical Provider, MD  magnesium hydroxide (MILK OF MAGNESIA) 400 MG/5ML suspension Take 30 mLs by mouth 2 (two) times daily as needed for mild constipation.   Yes Historical Provider, MD  mirtazapine (REMERON) 30 MG tablet Take 1 tablet (30 mg total) by mouth at bedtime. 12/06/15  Yes Mary-Margaret Daphine Deutscher, FNP  naproxen (NAPROSYN) 500 MG tablet Take BID for 1 week, then as BID prn for pain Patient taking differently: Take 500 mg by mouth 2 (two) times daily as needed for moderate pain. Take BID for 1 week, then as BID prn for pain 08/01/15  Yes Junie Spencer, FNP  polyethylene glycol powder (GLYCOLAX/MIRALAX) powder Take 17 g by mouth daily.    Yes Historical Provider, MD  solifenacin (VESICARE) 10 MG tablet Take 1 tablet (10 mg total) by mouth daily. 12/06/15  Yes Mary-Margaret Daphine Deutscher, FNP  valsartan (DIOVAN) 40 MG tablet Take 20 mg by mouth daily. Hold if SBP<110.   Yes Historical Provider, MD  levothyroxine (SYNTHROID, LEVOTHROID) 75 MCG tablet Take 1 tablet (75 mcg total) by mouth daily. Patient not taking: Reported on 09/06/2016 12/06/15   Mary-Margaret Daphine Deutscher, FNP  valsartan (DIOVAN) 80 MG tablet Take 1 tablet (80 mg total) by mouth daily. Patient not taking: Reported on 09/06/2016 06/07/15   Mary-Margaret Daphine Deutscher, FNP   Allergies  Allergen Reactions  . Keflex [Cephalexin]   . Penicillins     Unknown. Has patient had a PCN reaction causing immediate rash, facial/tongue/throat swelling, SOB or lightheadedness  with hypotension: unknown Has patient had a PCN reaction causing severe rash involving mucus membranes or skin necrosis: unknown Has patient had a PCN reaction that required hospitalization unknown Has patient had a PCN reaction occurring within the last 10 years: unknown If all of the above answers are "NO", then may proceed with Cephalosporin use.    Review of Systems  Unable to perform ROS: Dementia    Physical Exam  Constitutional: No distress.  Temporal wasting  HENT:  Head: Normocephalic and atraumatic.  temporal wasting  Cardiovascular: Normal rate and regular rhythm.   Pulmonary/Chest:  Effort normal. No respiratory distress.  Abdominal: Soft. She exhibits no distension.  Musculoskeletal: She exhibits no edema.  Neurological: She is alert.  Unable to determine orientation, one and two word sentences.   Skin: Skin is warm and dry.  Nursing note and vitals reviewed.   Vital Signs: BP (!) 105/45 (BP Location: Right Arm)   Pulse 78   Temp 98.6 F (37 C) (Oral)   Resp 15   Ht  (1.702 m)   Wt 62 kg (136 lb 11 oz)   SpO2 98%   BMI 21.41 kg/m  Pain Assessment: No/denies pain       SpO2: SpO2: 98 % O2 Device:SpO2: 98 % O2 Flow Rate: .O2 Flow Rate (L/min): 2 L/min  IO: Intake/output summary:  Intake/Output Summary (Last 24 hours) at 09/07/16 1349 Last data filed at 09/07/16 1300  Gross per 24 hour  Intake              820 ml  Output                0 ml  Net              820 ml    LBM:   Baseline Weight: Weight: 62.1 kg (137 lb) Most recent weight: Weight: 62 kg (136 lb 11 oz)     Palliative Assessment/Data:   Flowsheet Rows     Most Recent Value  Intake Tab  Referral Department  Hospitalist  Unit at Time of Referral  Med/Surg Unit  Palliative Care Primary Diagnosis  Pulmonary  Date Notified  09/06/16  Palliative Care Type  New Palliative care  Reason for referral  Clarify Goals of Care, Counsel Regarding Hospice  Date of Admission  09/06/16    Date first seen by Palliative Care  09/07/16  # of days Palliative referral response time  1 Day(s)  # of days IP prior to Palliative referral  0  Clinical Assessment  Palliative Performance Scale Score  30%  Pain Max last 24 hours  Not able to report  Pain Min Last 24 hours  Not able to report  Dyspnea Max Last 24 Hours  Not able to report  Dyspnea Min Last 24 hours  Not able to report  Psychosocial & Spiritual Assessment  Palliative Care Outcomes  Patient/Family meeting held?  Yes  Who was at the meeting?  DIL, Gracy Racer at bedside  Patient/Family wishes: Interventions discontinued/not started   Mechanical Ventilation      Time In: 1155 Time Out: 1230 Time Total: 35 minutes Greater than 50%  of this time was spent counseling and coordinating care related to the above assessment and plan.  Signed by: Katheran Awe, NP   Please contact Palliative Medicine Team phone at [phone] for questions and concerns.  For individual provider: See Loretha Stapler

## 2016-09-07 NOTE — Progress Notes (Addendum)
LCSW following:  Patient admitted from [facility]  Patient with end stage dementia,unable to complete assessment and has declined in health per MD report. Palliative has been consulted for GOC and family per MD has elected to not treat, focus more on comfort.  Call placed to [facility] with regards to care at facility in which she was total care, being fed all meals and required around the clock care. Agreeable to accept patient back with hospice if family desires. If patient's prognosis is short term, would recommend hospice home per facility.  Awaiting NP to visit family and complete meeting and define goals.  If patient discharges back to [facility] today, no CSW intervention necessary as patient is in observation status and less than 24 hour admit. She may require transportation back to facility. Spoke with Marchelle Folks at facility, patient able to return if MD discharges this afternoon.  EMS forms printed and placed on unit.  CM made aware of plan. Family in room agreeable for return. [facility]:  [phone]  No other needs  Deretha Emory, MSW Clinical Social Work: System Wide Float Coverage for :  [phone]

## 2016-09-07 NOTE — Care Management Obs Status (Signed)
MEDICARE OBSERVATION STATUS NOTIFICATION   Patient Details  Name: Ruth Case MRN: [id number] Date of Birth: February 18, 1928   Medicare Observation Status Notification Given:  Yes    Sol Englert, Chrystine Oiler, RN 09/07/2016, 12:06 PM

## 2016-09-08 LAB — URINE CULTURE: CULTURE: NO GROWTH

## 2016-09-08 LAB — MRSA CULTURE: Culture: NOT DETECTED

## 2016-10-17 ENCOUNTER — Other Ambulatory Visit: Payer: Self-pay | Admitting: *Deleted

## 2016-11-18 DEATH — deceased

## 2017-04-19 IMAGING — CR DG CHEST 2V
2 series · 2 of 2 positions shown · non-contrast
Comparison: 12/01/2014

CLINICAL DATA: Cough and congestion

EXAM:
CHEST  2 VIEW

[view not recorded (1 of 2)]
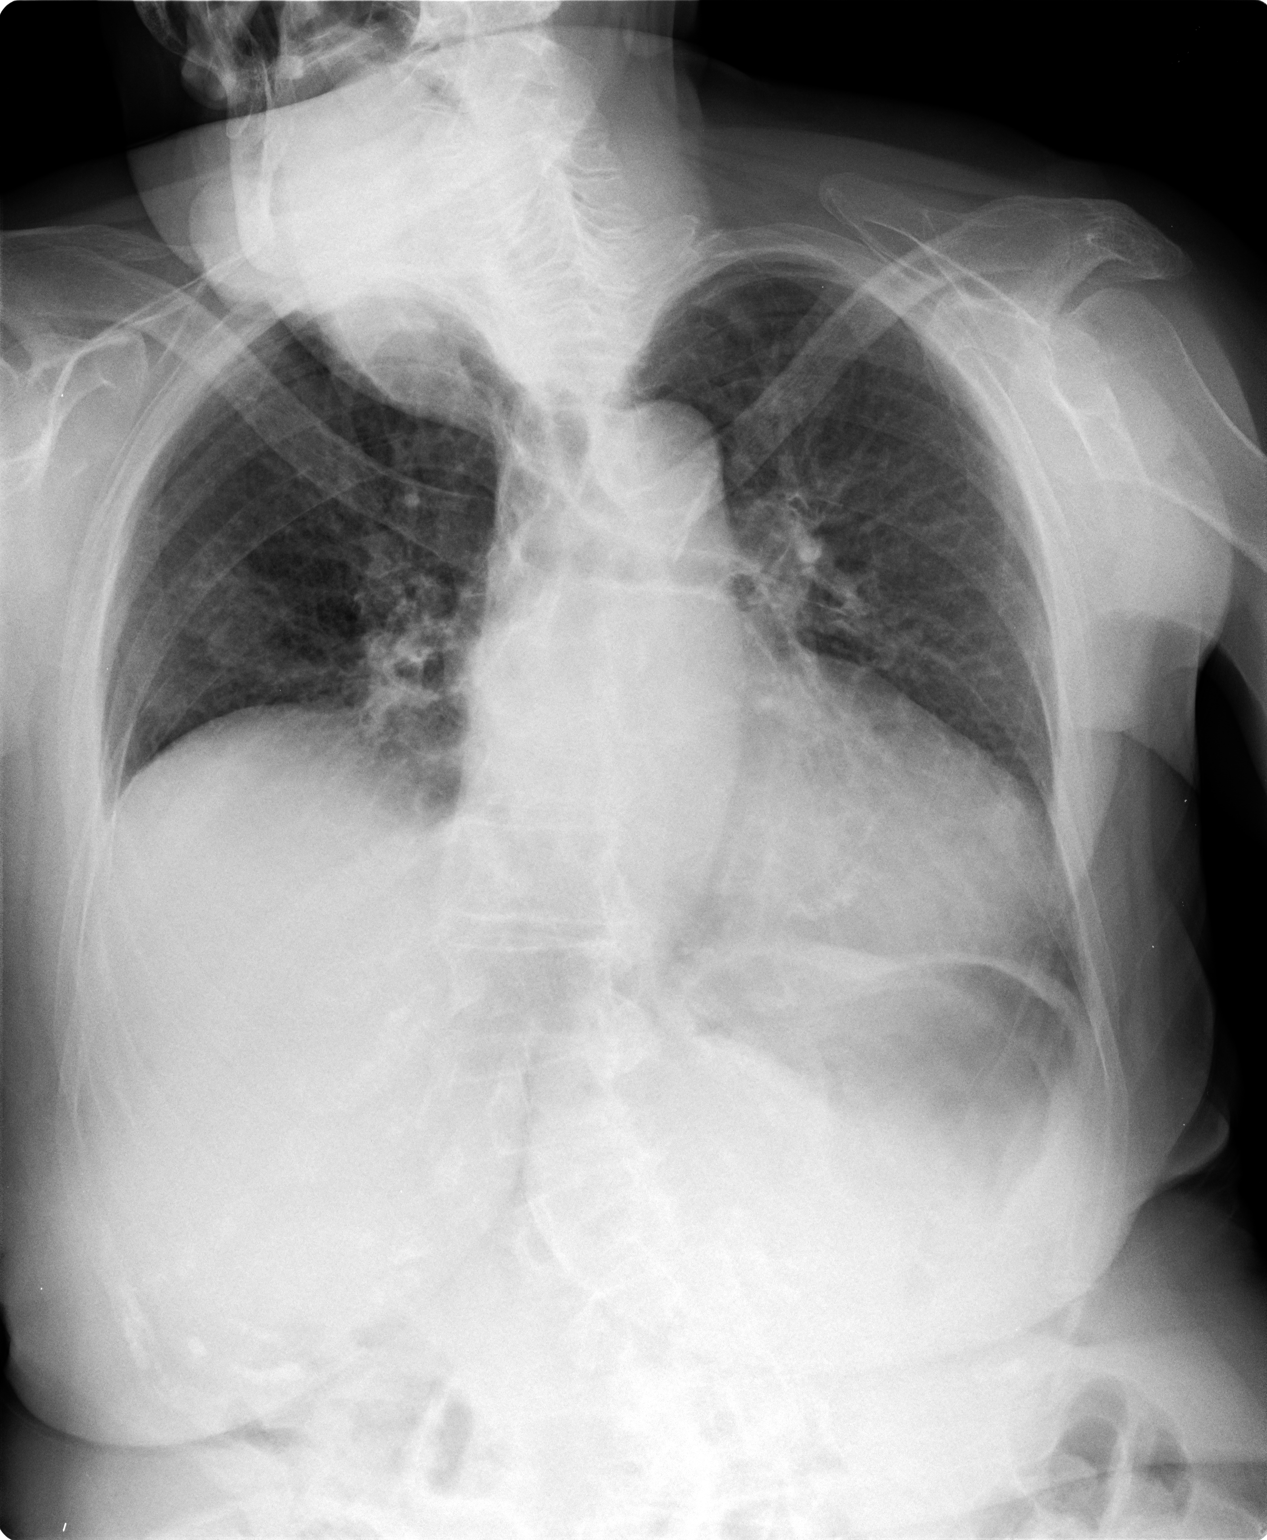

[view not recorded (2 of 2)]
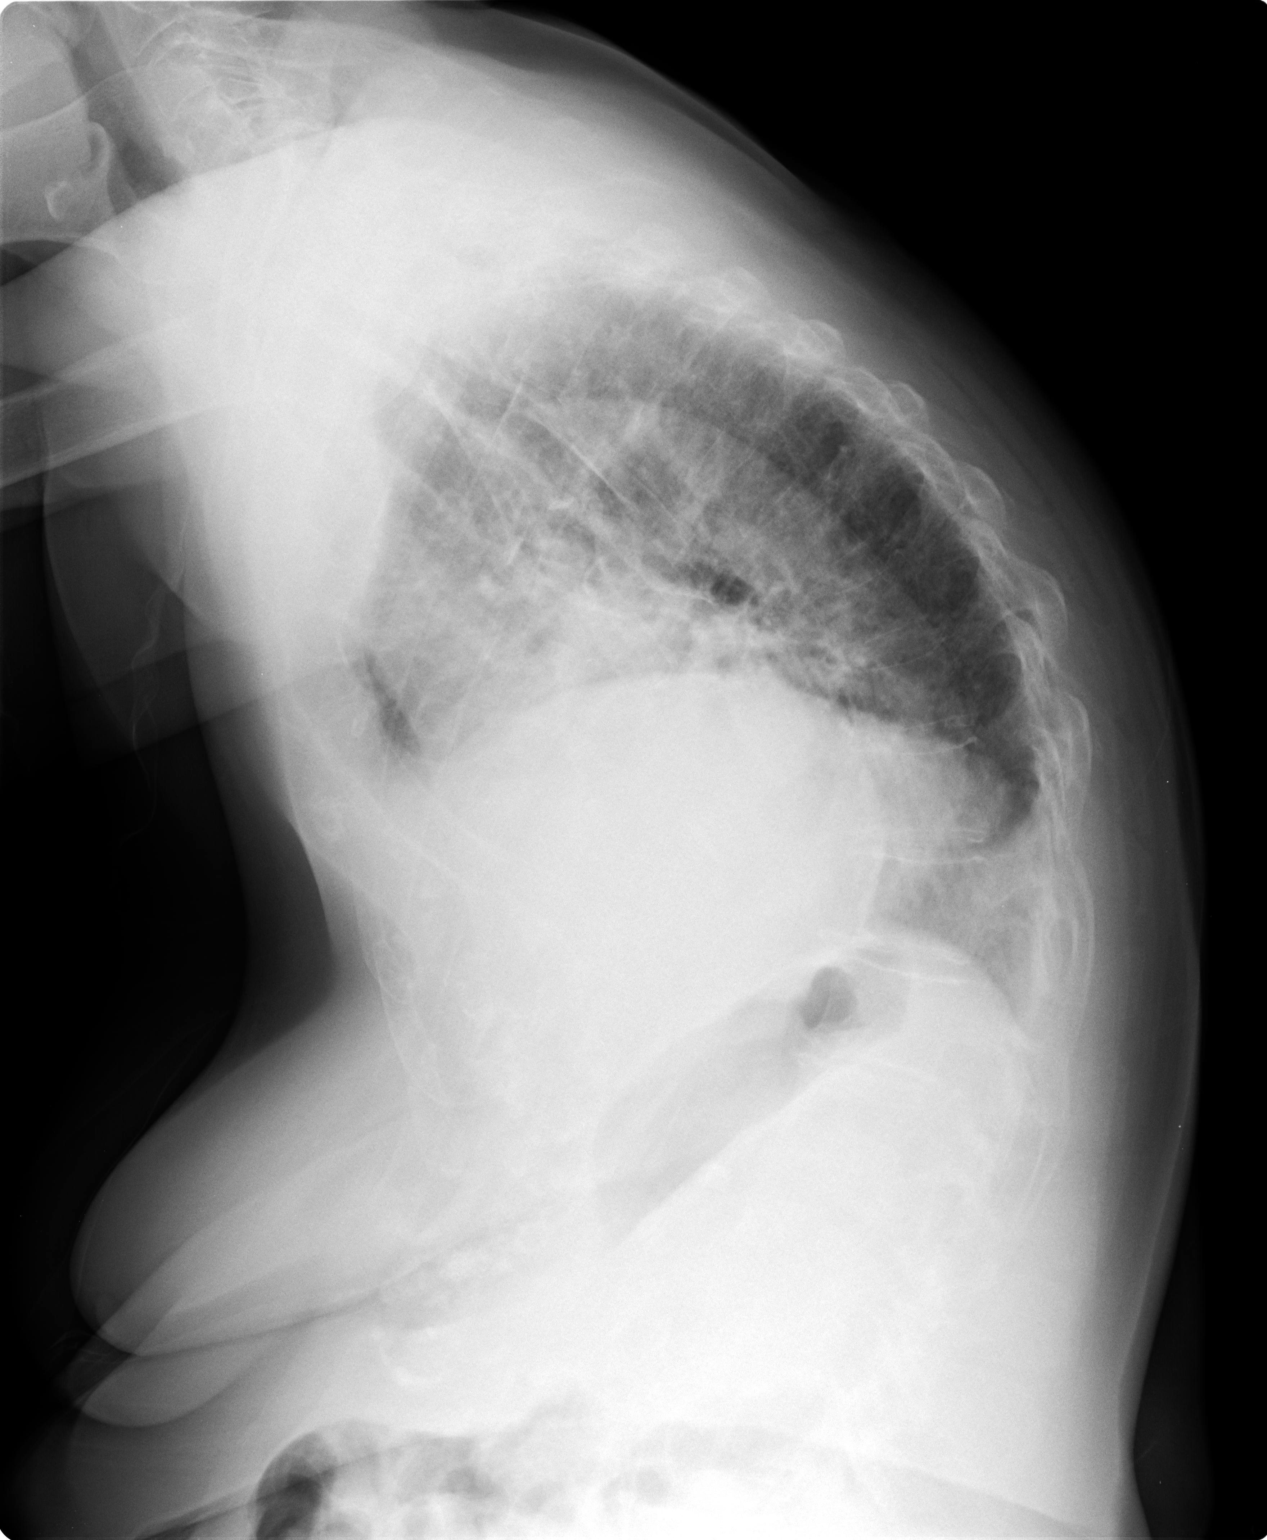

[2 of 2 positions shown; findings below may reference images not displayed]

FINDINGS: Mild cardiomegaly. Very low volumes. Hazy opacity in the right mid
lung and left base likely due to volume loss. Bronchitic changes are
chronic. No pleural effusion. No pneumothorax.
IMPRESSION: Cardiomegaly.  Bibasilar atelectasis.

## 2017-12-06 IMAGING — DX DG KNEE COMPLETE 4+V*L*
4 series · 4 of 4 positions shown · non-contrast
Comparison: None.

CLINICAL DATA: 87-year-old female with left knee pain.

EXAM:
LEFT KNEE - COMPLETE 4+ VIEW

[knee ap]
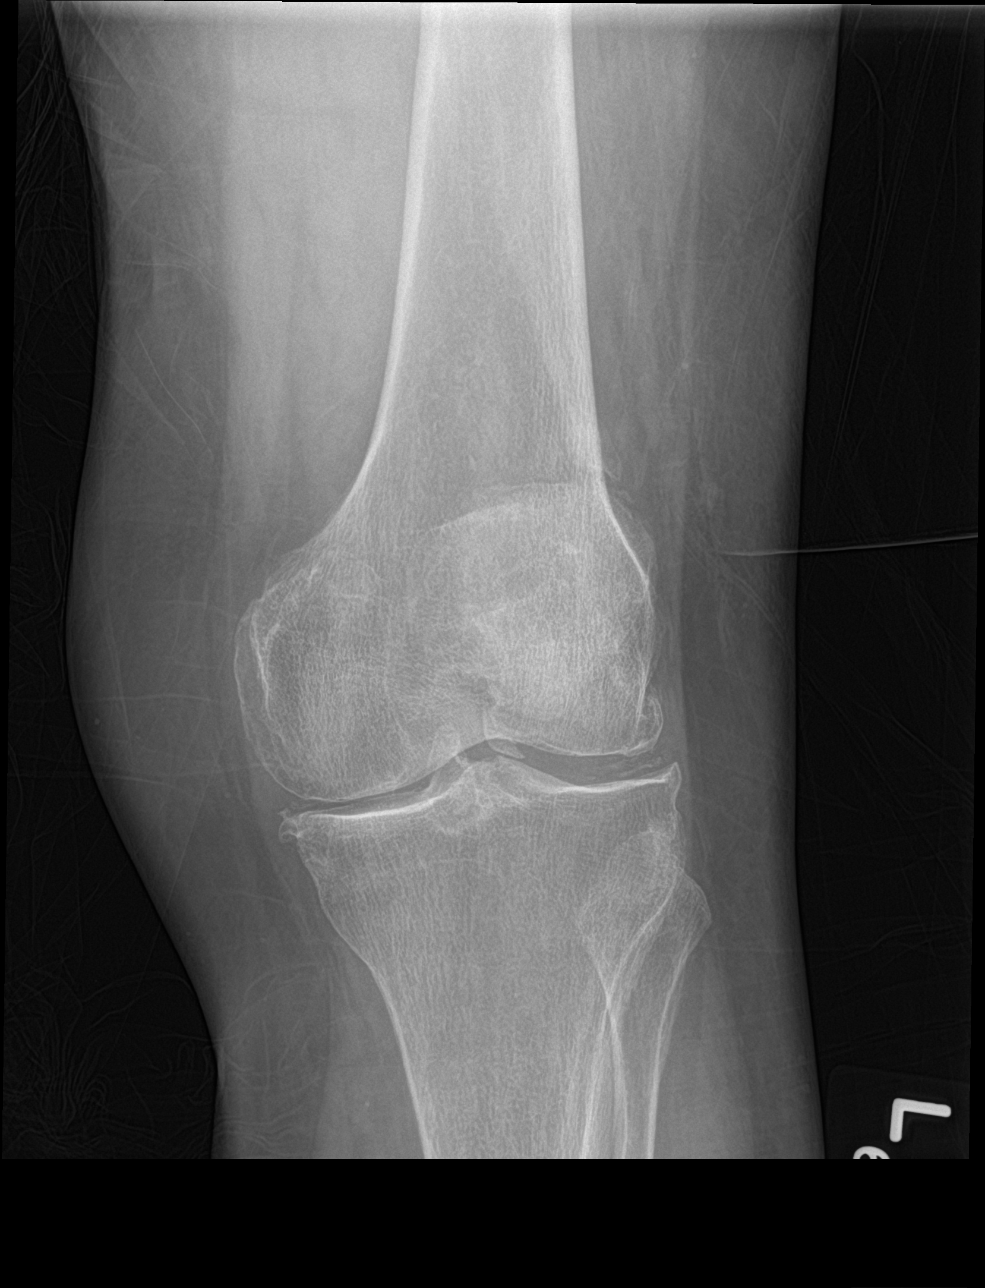

[tunnel]
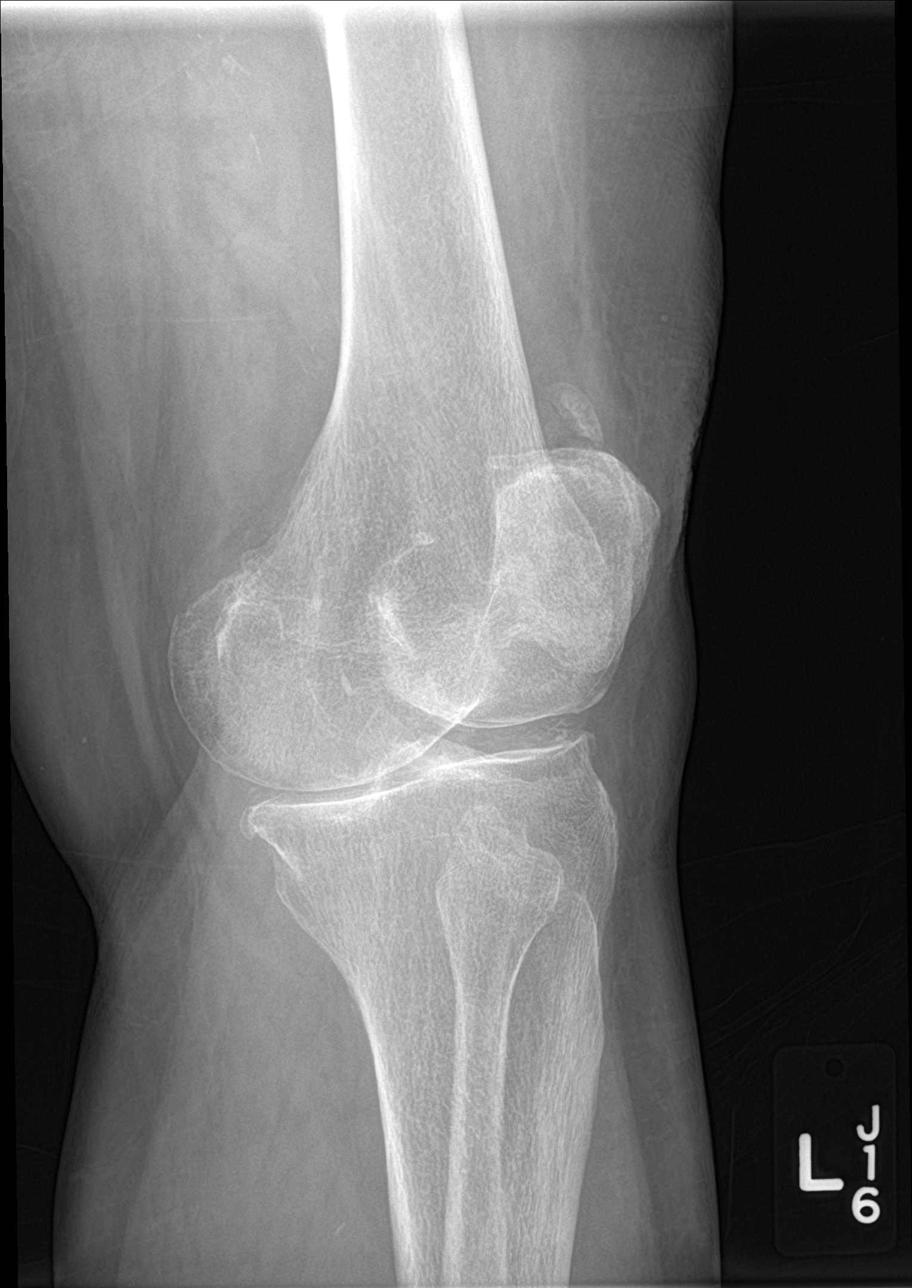

[knee sunrise]
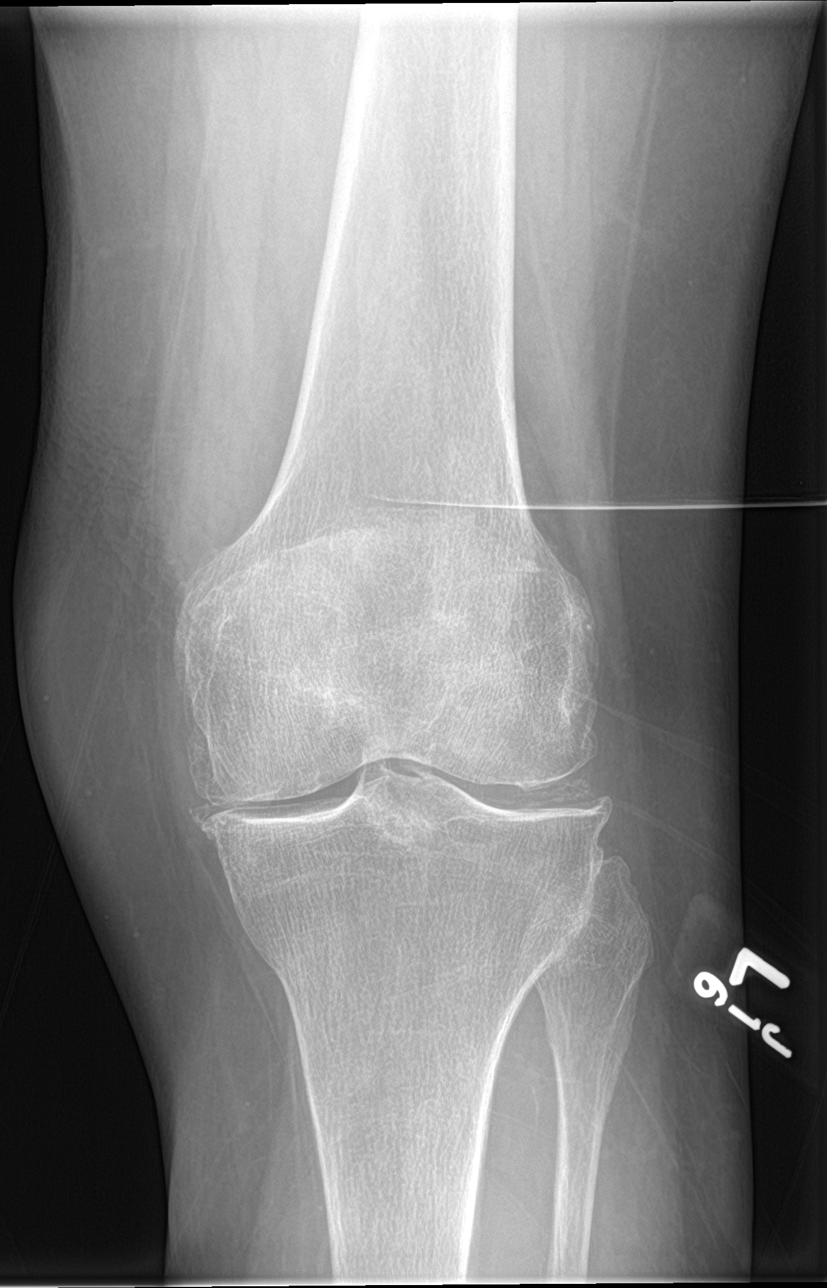

[knee lat]
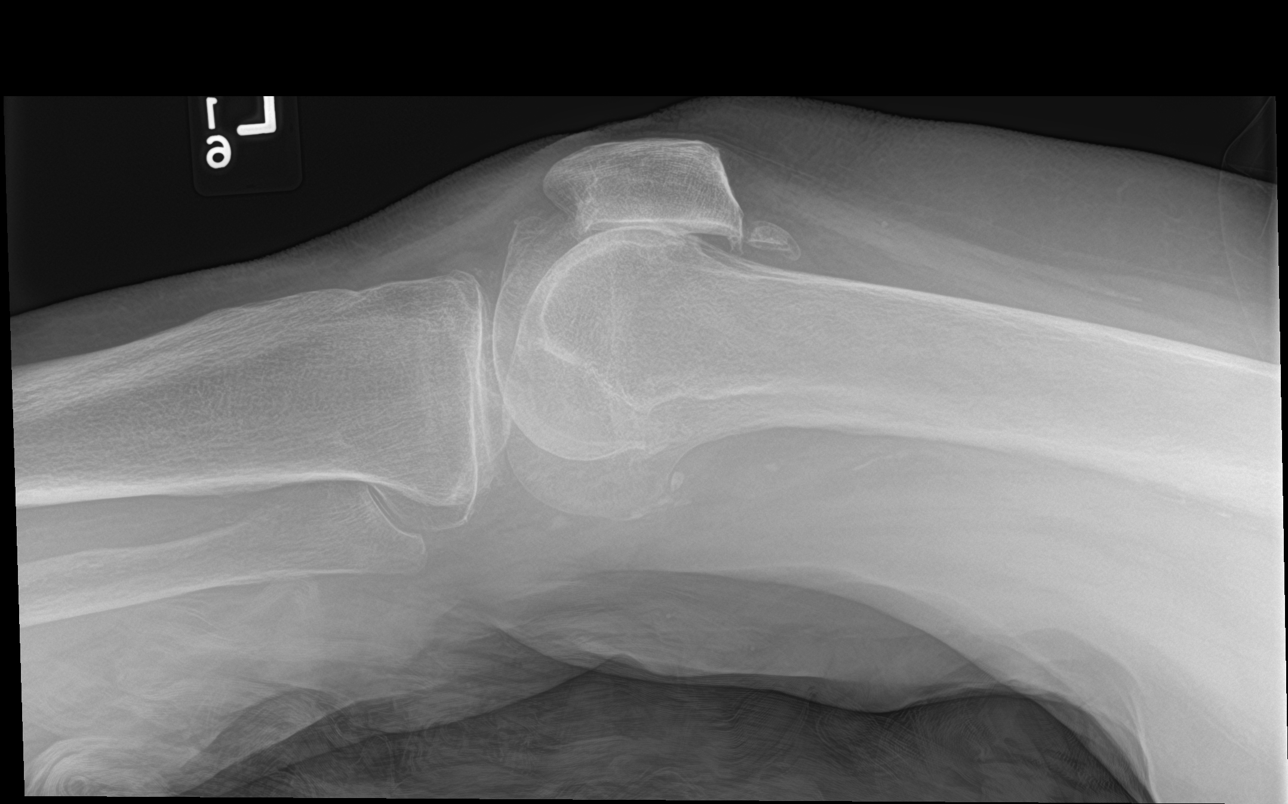

[4 of 4 positions shown; findings below may reference images not displayed]

FINDINGS: There is no acute fracture or dislocation. The bones are osteopenic.
There is osteoarthritic changes of the mean with tricompartmental
narrowing, chondrocalcinosis, and marginal spurring. No joint
effusion. The soft tissues are grossly unremarkable.
IMPRESSION: No acute fracture or dislocation.

Osteoarthritis.
